# Patient Record
Sex: Male | Born: 1960 | Race: Black or African American | Hispanic: No | Smoking: Never smoker
Health system: Southern US, Community
[De-identification: ages and names within clinical notes are randomized; demographics above are authoritative.]

## PROBLEM LIST (undated history)

## (undated) DIAGNOSIS — K625 Hemorrhage of anus and rectum: Secondary | ICD-10-CM

## (undated) HISTORY — DX: Hemorrhage of anus and rectum: K62.5

## (undated) HISTORY — PX: HERNIA REPAIR: SHX51

---

## 2001-03-28 HISTORY — PX: VASECTOMY: SHX75

## 2005-05-07 ENCOUNTER — Emergency Department: Payer: Self-pay | Admitting: Emergency Medicine

## 2008-06-02 ENCOUNTER — Emergency Department: Payer: Self-pay | Admitting: Emergency Medicine

## 2010-05-24 ENCOUNTER — Emergency Department (HOSPITAL_COMMUNITY): Admission: EM | Admit: 2010-05-24 | Discharge: 2010-05-24 | Payer: Self-pay | Admitting: Emergency Medicine

## 2010-07-29 ENCOUNTER — Emergency Department (HOSPITAL_COMMUNITY): Admission: EM | Admit: 2010-07-29 | Discharge: 2010-07-29 | Payer: Self-pay | Admitting: Emergency Medicine

## 2010-12-14 ENCOUNTER — Emergency Department (HOSPITAL_COMMUNITY): Payer: Self-pay

## 2010-12-14 ENCOUNTER — Emergency Department (HOSPITAL_COMMUNITY)
Admission: EM | Admit: 2010-12-14 | Discharge: 2010-12-15 | Disposition: A | Discharge status: Home / Self Care | Payer: Self-pay | Attending: Emergency Medicine | Admitting: Emergency Medicine

## 2010-12-14 DIAGNOSIS — R220 Localized swelling, mass and lump, head: Secondary | ICD-10-CM | POA: Insufficient documentation

## 2010-12-14 DIAGNOSIS — R221 Localized swelling, mass and lump, neck: Secondary | ICD-10-CM | POA: Insufficient documentation

## 2010-12-14 DIAGNOSIS — R509 Fever, unspecified: Secondary | ICD-10-CM | POA: Insufficient documentation

## 2010-12-14 DIAGNOSIS — J36 Peritonsillar abscess: Secondary | ICD-10-CM | POA: Insufficient documentation

## 2010-12-14 DIAGNOSIS — R599 Enlarged lymph nodes, unspecified: Secondary | ICD-10-CM | POA: Insufficient documentation

## 2010-12-14 LAB — DIFFERENTIAL
Basophils Absolute: 0.1 10*3/uL (ref 0.0–0.1)
Basophils Relative: 0 % (ref 0–1)
Eosinophils Absolute: 0.2 10*3/uL (ref 0.0–0.7)
Monocytes Relative: 8 % (ref 3–12)
Neutro Abs: 11.1 10*3/uL — ABNORMAL HIGH (ref 1.7–7.7)
Neutrophils Relative %: 75 % (ref 43–77)

## 2010-12-14 LAB — CBC
Hemoglobin: 15 g/dL (ref 13.0–17.0)
MCH: 27.6 pg (ref 26.0–34.0)
MCHC: 34 g/dL (ref 30.0–36.0)
Platelets: 274 10*3/uL (ref 150–400)
RBC: 5.44 MIL/uL (ref 4.22–5.81)

## 2010-12-14 LAB — PROTIME-INR
INR: 1.01 (ref 0.00–1.49)
Prothrombin Time: 13.5 seconds (ref 11.6–15.2)

## 2010-12-14 LAB — POCT I-STAT, CHEM 8
HCT: 48 % (ref 39.0–52.0)
Hemoglobin: 16.3 g/dL (ref 13.0–17.0)
Potassium: 3.9 mEq/L (ref 3.5–5.1)
Sodium: 139 mEq/L (ref 135–145)
TCO2: 25 mmol/L (ref 0–100)

## 2010-12-14 LAB — RAPID STREP SCREEN (MED CTR MEBANE ONLY): Streptococcus, Group A Screen (Direct): NEGATIVE

## 2010-12-14 MED ORDER — IOHEXOL 300 MG/ML  SOLN: 100.0000 mL | Freq: Once | INTRAMUSCULAR | Status: AC | PRN

## 2011-01-10 LAB — URINALYSIS, ROUTINE W REFLEX MICROSCOPIC
Bilirubin Urine: NEGATIVE
Nitrite: NEGATIVE
Protein, ur: 30 mg/dL — AB
Specific Gravity, Urine: 1.018 (ref 1.005–1.030)
Urobilinogen, UA: 1 mg/dL (ref 0.0–1.0)

## 2011-01-12 LAB — URINALYSIS, ROUTINE W REFLEX MICROSCOPIC
Glucose, UA: NEGATIVE mg/dL
Hgb urine dipstick: NEGATIVE
pH: 5.5 (ref 5.0–8.0)

## 2011-01-12 LAB — URINE CULTURE
Colony Count: NO GROWTH
Culture: NO GROWTH

## 2012-03-16 ENCOUNTER — Encounter (INDEPENDENT_AMBULATORY_CARE_PROVIDER_SITE_OTHER): Payer: Self-pay | Admitting: General Surgery

## 2012-03-16 ENCOUNTER — Ambulatory Visit (INDEPENDENT_AMBULATORY_CARE_PROVIDER_SITE_OTHER): Payer: Self-pay | Admitting: General Surgery

## 2012-03-16 VITALS — BP 142/80 | HR 72 | Temp 97.2°F | Resp 16 | Ht 72.0 in | Wt 217.2 lb

## 2012-03-16 DIAGNOSIS — K4090 Unilateral inguinal hernia, without obstruction or gangrene, not specified as recurrent: Secondary | ICD-10-CM

## 2012-03-16 NOTE — Patient Instructions (Signed)
[facility] ° °UMBILICAL OR INGUINAL HERNIA REPAIR: POST OP INSTRUCTIONS ° °Always review your discharge instruction sheet given to you by the facility where your surgery was performed. °IF YOU HAVE DISABILITY OR FAMILY LEAVE FORMS, YOU MUST BRING THEM TO THE OFFICE FOR PROCESSING.   °DO NOT GIVE THEM TO YOUR DOCTOR. ° °1. A  prescription for pain medication may be given to you upon discharge.  Take your pain medication as prescribed, if needed.  If narcotic pain medicine is not needed, then you may take acetaminophen (Tylenol), naprosyn (Alleve) or ibuprofen (Advil) as needed. °2. Take your usually prescribed medications unless otherwise directed. °3. If you need a refill on your pain medication, please contact your pharmacy.  They will contact our office to request authorization. Prescriptions will not be filled after 5 pm or on week-ends. °4. You should follow a light diet the first 24 hours after arrival home, such as soup and crackers, etc.  Be sure to include lots of fluids daily.  Resume your normal diet the day after surgery. °5. Most patients will experience some swelling and bruising around the umbilicus or in the groin and scrotum.  Ice packs and reclining will help.  Swelling and bruising can take several days to resolve.  °6. It is common to experience some constipation if taking pain medication after surgery.  Increasing fluid intake and taking a stool softener (such as Colace) will usually help or prevent this problem from occurring.  A mild laxative (Milk of Magnesia or Miralax) should be taken according to package directions if there are no bowel movements after 48 hours. °7. Unless discharge instructions indicate otherwise, you may remove your bandages 48 hours after surgery, and you may shower at that time.  You may have steri-strips (small skin tapes) in place directly over the incision.  These strips should be left on the skin for 7-10 days and will come off on their own.   If your surgeon used skin glue on the incision, you may shower in 24 hours.  The glue will flake off over the next 2-3 weeks.  Any sutures or staples will be removed at the office during your follow-up visit. °8. ACTIVITIES:  You may resume regular (light) daily activities beginning the next day--such as daily self-care, walking, climbing stairs--gradually increasing activities as tolerated.  You may have sexual intercourse when it is comfortable.  Refrain from any heavy lifting or straining until approved by your doctor. °a. You may drive when you are no longer taking prescription pain medication, you can comfortably wear a seatbelt, and you can safely maneuver your car and apply brakes. °b. RETURN TO WORK:  __________________________________________________________ °9. You should see your doctor in the office for a follow-up appointment approximately 2-3 weeks after your surgery.  Make sure that you call for this appointment within a day or two after you arrive home to insure a convenient appointment time. °10. OTHER INSTRUCTIONS:  __________________________________________________________________________________________________________________________________________________________________________________________  °WHEN TO CALL YOUR DOCTOR: °1. Fever over 101.0 °2. Inability to urinate °3. Nausea and/or vomiting °4. Extreme swelling or bruising °5. Continued bleeding from incision. °6. Increased pain, redness, or drainage from the incision ° °The clinic staff is available to answer your questions during regular business hours.  Please don’t hesitate to call and ask to speak to one of the nurses for clinical concerns.  If you have a medical emergency, go to the nearest emergency room or call 911.  A surgeon from [facility]   is always on call at the hospital ° ° °[address], [address], [place], [place]  [address] ? ° [address] [address], [place], [place]   [address] °[phone] ? [phone] ? FAX  [phone] °Web site: [email or web address] ° ° °

## 2012-03-16 NOTE — Progress Notes (Signed)
Patient ID: Richard Haynes, male   DOB: [birth year], 51 y.o.   MRN: [id number]  Chief Complaint  Patient presents with  . Inguinal Hernia    HPI Richard Haynes is a 51 y.o. male.  Self referred HPI 51 yom with an over 10 year history of an increasing in size more painful left groin bulge.  This area now is out almost all the time and doesn't reduce when he lies down as it used to.  He does have some discomfort at the site also.  He reports some straining with urination and with bowel movements. Also states he has some occasional rectal pain and brb on tp.  The left groin pain is worse with movement or straining.  It is relieved with rest. He comes in today to discuss options for repair.  Past Medical History  Diagnosis Date  . Inguinal hernia   . Rectal bleeding     Past Surgical History  Procedure Date  . Vasectomy 03/2001    History reviewed. No pertinent family history.  Social History History  Substance Use Topics  . Smoking status: Never Smoker   . Smokeless tobacco: Never Used  . Alcohol Use: Yes     social    No Known Allergies  No current outpatient prescriptions on file.    Review of Systems Review of Systems  Constitutional: Negative for fever, chills and unexpected weight change.  HENT: Negative for hearing loss, congestion, sore throat, trouble swallowing and voice change.   Eyes: Negative for visual disturbance.  Respiratory: Negative for cough and wheezing.   Cardiovascular: Negative for chest pain, palpitations and leg swelling.  Gastrointestinal: Positive for anal bleeding (consistent with hemorrhoids that he says flare at times, will refer for colonoscopy once hernia fixed). Negative for nausea, vomiting, abdominal pain, diarrhea, constipation, blood in stool, abdominal distention and rectal pain.  Genitourinary: Negative for hematuria and difficulty urinating.  Musculoskeletal: Negative for arthralgias.  Skin: Negative for rash and wound.    Neurological: Negative for seizures, syncope, weakness and headaches.  Hematological: Negative for adenopathy. Does not bruise/bleed easily.  Psychiatric/Behavioral: Negative for confusion.    Blood pressure 142/80, pulse 72, temperature 97.2 F (36.2 C), temperature source Temporal, resp. rate 16, height 6' (1.829 m), weight 217 lb 4 oz (98.544 kg).  Physical Exam Physical Exam  Vitals reviewed. Constitutional: He appears well-developed and well-nourished.  Neck: Neck supple.  Cardiovascular: Normal rate, regular rhythm and normal heart sounds.   Pulmonary/Chest: Effort normal and breath sounds normal. He has no wheezes. He has no rales.  Abdominal: Soft. Bowel sounds are normal. There is no tenderness. A hernia is present. Hernia confirmed positive in the left inguinal area (nontender difficult to reduce left groin hernia). Hernia confirmed negative in the right inguinal area.  Lymphadenopathy:    He has no cervical adenopathy.       Right: No inguinal adenopathy present.       Left: No inguinal adenopathy present.     Assessment    Left inguinal hernia    Plan    We discussed observation versus repair.  We discussed both laparoscopic and open inguinal hernia repairs. I described the procedure in detail.  The patient was given educational material.  Goals should be achieved with surgery. We discussed the usage of mesh and the rationale behind that. We went over the pathophysiology of inguinal hernia. We have elected to perform open inguinal hernia repair with mesh.  We discussed the risks including bleeding,  infection, recurrence, postoperative pain and chronic groin pain, testicular injury, urinary retention, numbness in groin and around incision.         Deseree Zemaitis 03/16/2012, 11:15 PM

## 2012-03-22 IMAGING — CT CT NECK W/ CM
4 series · 11 of 20 positions shown, 12 images · IV contrast (omnipaque)
Comparison: None

CLINICAL DATA: Sore throat.  Left neck swelling.

CT NECK WITH CONTRAST
TECHNIQUE: Multidetector CT imaging of the neck was performed with
intravenous contrast.
Contrast: 75 ml Omnipaque 300 IV.

[Series 2: 2cc/30ml and 1cc/45ml · axial · 0.56mm/px · z∈[-293,-128]mm · 3 of 133 slices shown]
[im 34/133  bone]
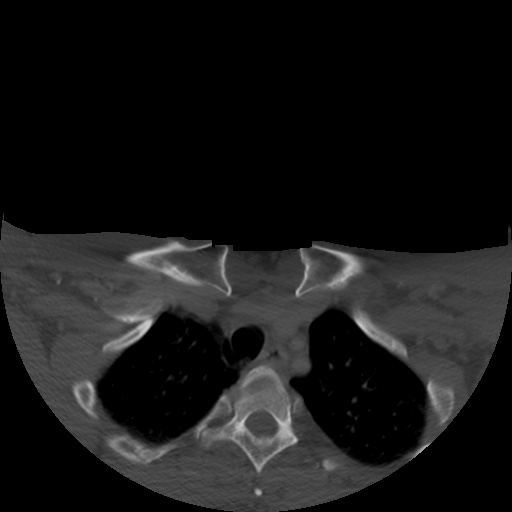
[im 67/133  bone]
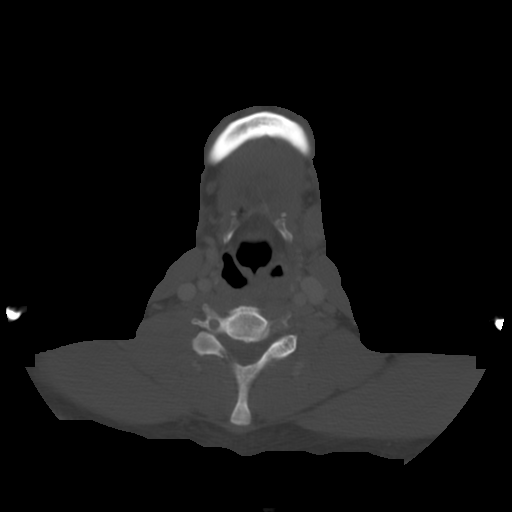
[im 100/133  bone]
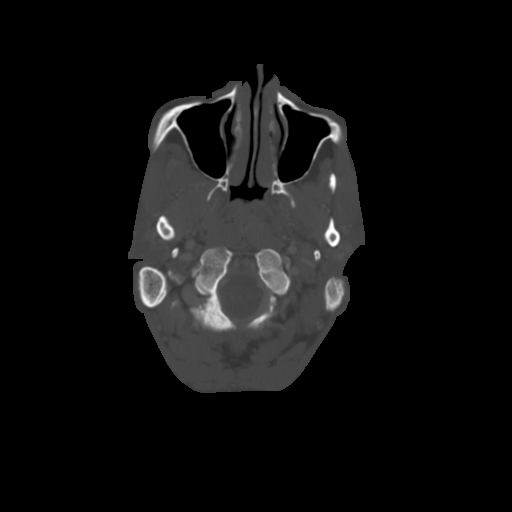

[Series 300: sag · sagittal · 0.66mm/px · 2 of 125 slices shown]
[im 42/125  bone]
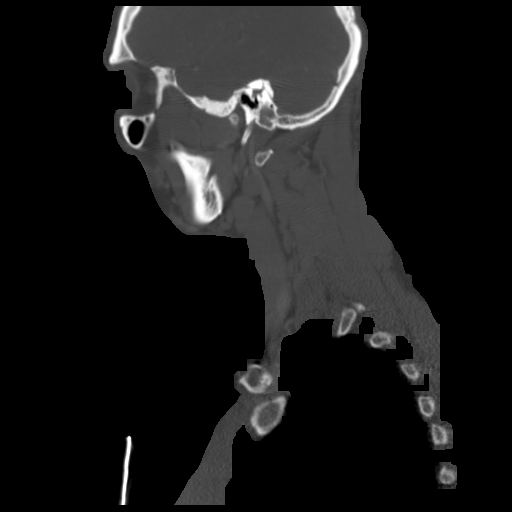
[im 83/125  bone]
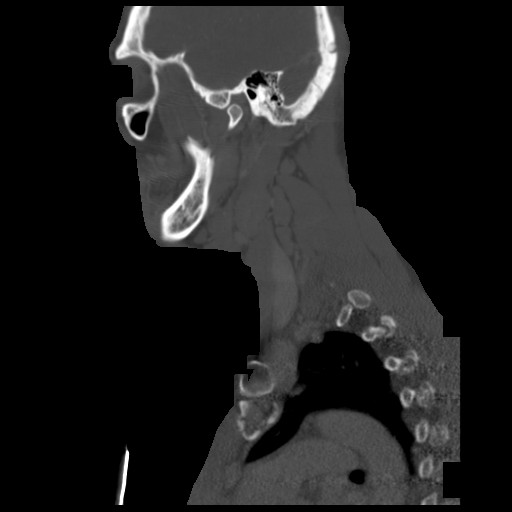

[Series 301: cor · coronal · 0.66mm/px · 3 of 158 slices shown]
[im 59/158  bone]
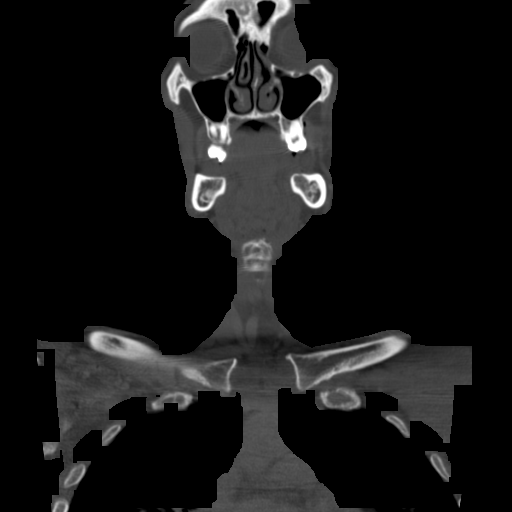
[im 70/158  bone]
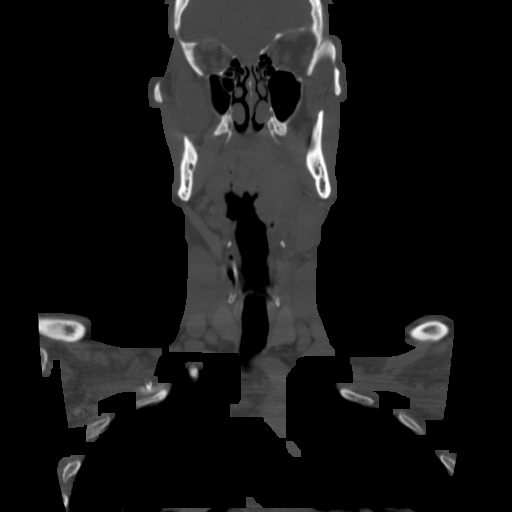
[im 81/158  bone]
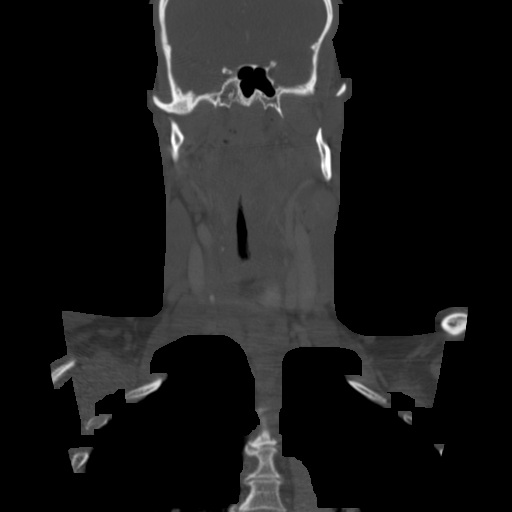

[Series 302: hyoid · axial · 0.66mm/px · z∈[-365,-224]mm · 3 of 166 slices shown, 4 images]
[im 42/166  soft-tissue]
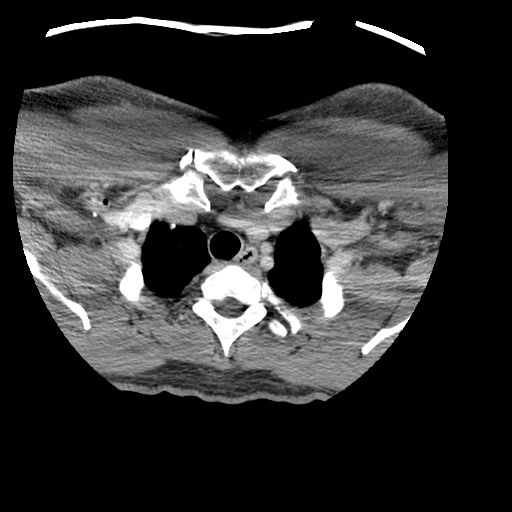
[im 42/166  bone]
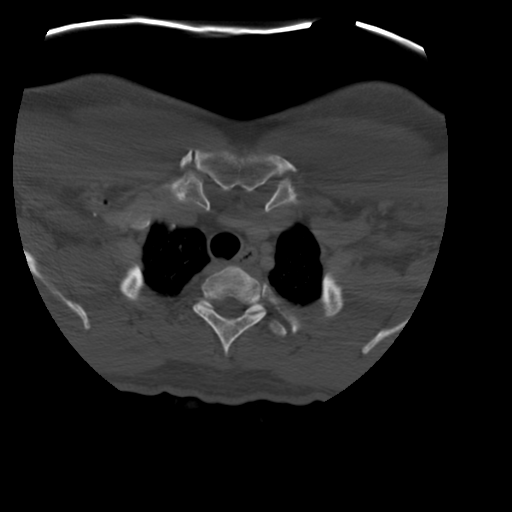
[im 83/166  bone]
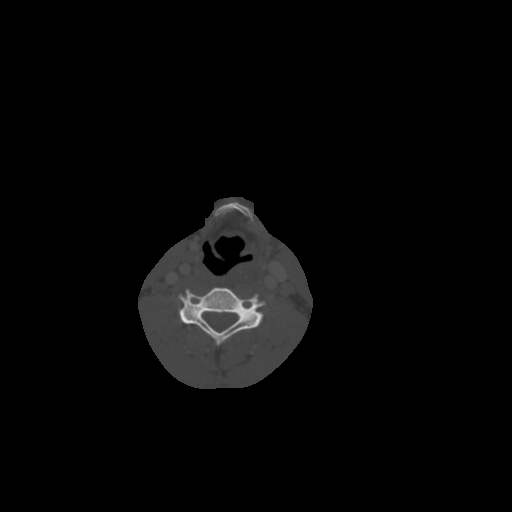
[im 124/166  bone]
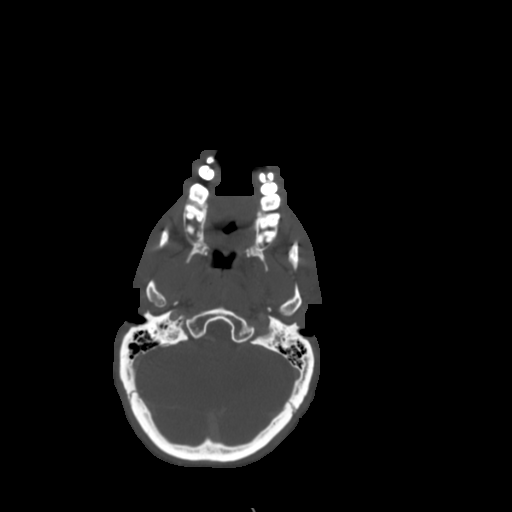

[11 of 20 positions shown; findings below may reference images not displayed]

FINDINGS: There is diffuse enlargement of the tonsils.  This is
most pronounced in the left tonsils.  There is a focal low density
area within the left tonsil measuring 17 x 12 mm compatible with
left tonsillar abscess.  Enlarged cervical lymph nodes are noted
bilaterally in all nodal stations.  This is more pronounced on the
left.  Largest node is a left jugulodigastric node measuring 2.2 x
1.9 cm (corresponds to area of palpable abnormality).

Airway is patent.  Epiglottis and aryepiglottic folds are normal.
Thyroid and salivary glands unremarkable.  Paranasal sinuses are
clear.
IMPRESSION: Tonsillar enlargement bilaterally, left greater than right.  17 mm
abscess in the left tonsil.

Bilateral cervical adenopathy, left worse than right.  The palpable
area of swelling in the left neck corresponds to the largest lymph
node measuring up to 2.2 cm.

## 2012-04-08 ENCOUNTER — Encounter (HOSPITAL_BASED_OUTPATIENT_CLINIC_OR_DEPARTMENT_OTHER): Payer: Self-pay

## 2012-04-08 ENCOUNTER — Ambulatory Visit (HOSPITAL_BASED_OUTPATIENT_CLINIC_OR_DEPARTMENT_OTHER): Admit: 2012-04-08 | Payer: Self-pay | Admitting: General Surgery

## 2012-04-08 SURGERY — REPAIR, HERNIA, INGUINAL, LAPAROSCOPIC
Anesthesia: General | Laterality: Right

## 2015-08-13 ENCOUNTER — Emergency Department
Admission: EM | Admit: 2015-08-13 | Discharge: 2015-08-13 | Disposition: A | Discharge status: Home / Self Care | Payer: 59 | Attending: Emergency Medicine | Admitting: Emergency Medicine

## 2015-08-13 ENCOUNTER — Encounter: Payer: Self-pay | Admitting: Emergency Medicine

## 2015-08-13 DIAGNOSIS — K4090 Unilateral inguinal hernia, without obstruction or gangrene, not specified as recurrent: Secondary | ICD-10-CM

## 2015-08-13 DIAGNOSIS — K4091 Unilateral inguinal hernia, without obstruction or gangrene, recurrent: Secondary | ICD-10-CM | POA: Insufficient documentation

## 2015-08-13 NOTE — Discharge Instructions (Signed)
You were evaluated for left-sided inguinal hernia.  Return to the emergency department for any worsening pain, including if the hernia get stuck in the out positioning and you cannot Push it back in.  I'm recommending follow-up with surgeon, Dr. Egbert GaribaldiBird, to discuss outpatient surgical repair. Call tomorrow to make an appointment next 1-2 weeks.   Inguinal Hernia, Adult Muscles help keep everything in the body in its proper place. But if a weak spot in the muscles develops, something can poke through. That is called a hernia. When this happens in the lower part of the belly (abdomen), it is called an inguinal hernia. (It takes its name from a part of the body in this region called the inguinal canal.) A weak spot in the wall of muscles lets some fat or part of the small intestine bulge through. An inguinal hernia can develop at any age. Men get them more often than women. CAUSES  In adults, an inguinal hernia develops over time.  It can be triggered by:  Suddenly straining the muscles of the lower abdomen.  Lifting heavy objects.  Straining to have a bowel movement. Difficult bowel movements (constipation) can lead to this.  Constant coughing. This may be caused by smoking or lung disease.  Being overweight.  Being pregnant.  Working at a job that requires long periods of standing or heavy lifting.  Having had an inguinal hernia before. One type can be an emergency situation. It is called a strangulated inguinal hernia. It develops if part of the small intestine slips through the weak spot and cannot get back into the abdomen. The blood supply can be cut off. If that happens, part of the intestine may die. This situation requires emergency surgery. SYMPTOMS  Often, a small inguinal hernia has no symptoms. It is found when a healthcare provider does a physical exam. Larger hernias usually have symptoms.   In adults, symptoms may include:  A lump in the groin. This is easier to see when  the person is standing. It might disappear when lying down.  In men, a lump in the scrotum.  Pain or burning in the groin. This occurs especially when lifting, straining or coughing.  A dull ache or feeling of pressure in the groin.  Signs of a strangulated hernia can include:  A bulge in the groin that becomes very painful and tender to the touch.  A bulge that turns red or purple.  Fever, nausea and vomiting.  Inability to have a bowel movement or to pass gas. DIAGNOSIS  To decide if you have an inguinal hernia, a healthcare provider will probably do a physical examination.  This will include asking questions about any symptoms you have noticed.  The healthcare provider might feel the groin area and ask you to cough. If an inguinal hernia is felt, the healthcare provider may try to slide it back into the abdomen.  Usually no other tests are needed. TREATMENT  Treatments can vary. The size of the hernia makes a difference. Options include:  Watchful waiting. This is often suggested if the hernia is small and you have had no symptoms.  No medical procedure will be done unless symptoms develop.  You will need to watch closely for symptoms. If any occur, contact your healthcare provider right away.  Surgery. This is used if the hernia is larger or you have symptoms.  Open surgery. This is usually an outpatient procedure (you will not stay overnight in a hospital). An cut (incision) is made  through the skin in the groin. The hernia is put back inside the abdomen. The weak area in the muscles is then repaired by herniorrhaphy or hernioplasty. Herniorrhaphy: in this type of surgery, the weak muscles are sewn back together. Hernioplasty: a patch or mesh is used to close the weak area in the abdominal wall.  Laparoscopy. In this procedure, a surgeon makes small incisions. A thin tube with a tiny video camera (called a laparoscope) is put into the abdomen. The surgeon repairs the hernia  with mesh by looking with the video camera and using two long instruments. HOME CARE INSTRUCTIONS   After surgery to repair an inguinal hernia:  You will need to take pain medicine prescribed by your healthcare provider. Follow all directions carefully.  You will need to take care of the wound from the incision.  Your activity will be restricted for awhile. This will probably include no heavy lifting for several weeks. You also should not do anything too active for a few weeks. When you can return to work will depend on the type of job that you have.  During "watchful waiting" periods, you should:  Maintain a healthy weight.  Eat a diet high in fiber (fruits, vegetables and whole grains).  Drink plenty of fluids to avoid constipation. This means drinking enough water and other liquids to keep your urine clear or pale yellow.  Do not lift heavy objects.  Do not stand for long periods of time.  Quit smoking. This should keep you from developing a frequent cough. SEEK MEDICAL CARE IF:   A bulge develops in your groin area.  You feel pain, a burning sensation or pressure in the groin. This might be worse if you are lifting or straining.  You develop a fever of more than 100.5 F (38.1 C). SEEK IMMEDIATE MEDICAL CARE IF:   Pain in the groin increases suddenly.  A bulge in the groin gets bigger suddenly and does not go down.  For men, there is sudden pain in the scrotum. Or, the size of the scrotum increases.  A bulge in the groin area becomes red or purple and is painful to touch.  You have nausea or vomiting that does not go away.  You feel your heart beating much faster than normal.  You cannot have a bowel movement or pass gas.  You develop a fever of more than 102.0 F (38.9 C).   This information is not intended to replace advice given to you by your health care provider. Make sure you discuss any questions you have with your health care provider.   Document  Released: 03/02/2009 Document Revised: 01/06/2012 Document Reviewed: 04/17/2015 Elsevier Interactive Patient Education [employer]

## 2015-08-13 NOTE — ED Provider Notes (Signed)
[facility] Emergency Department Provider Note   ____________________________________________  Time seen: 2:45 PM I have reviewed the triage vital signs and the triage nursing note.  HISTORY  Chief Complaint Inguinal Hernia   Historian Patient  HPI Richard Haynes is a 54 y.o. male who is here for evaluation of left-sided inguinal hernia. He has a history of an inguinal hernia which was intermittently out for several years. He has recently started a new job where there is some heavy lifting and pushing, and he is noted increased pain at the left groin, increased swelling, and the hernia has been in the out position for about 2 weeks now. He is able to push it back in from a standing position and sometimes from lying down.  Patient was offered surgical repair several years ago when he had his vasectomy, at that point time he decided to hold off. He is interested in referral to have this surgically repaired at this time.    Past Medical History  Diagnosis Date  . Inguinal hernia   . Rectal bleeding     There are no active problems to display for this patient.   Past Surgical History  Procedure Laterality Date  . Vasectomy  03/2001    No current outpatient prescriptions on file.  Allergies Iodine  No family history on file.  Social History Social History  Substance Use Topics  . Smoking status: Never Smoker   . Smokeless tobacco: Never Used  . Alcohol Use: Yes     Comment: social    Review of Systems  Constitutional: Negative for fever. Eyes: Negative for visual changes. ENT: Negative for sore throat. Cardiovascular: Negative for chest pain. Respiratory: Negative for shortness of breath. Gastrointestinal: Negative for abdominal pain, vomiting and diarrhea. Genitourinary: Negative for dysuria. Musculoskeletal: Negative for back pain. Skin: Negative for rash. Neurological: Negative for headache. 10 point Review of Systems otherwise  negative ____________________________________________   PHYSICAL EXAM:  VITAL SIGNS: ED Triage Vitals  Enc Vitals Group     BP 08/13/15 1400 124/74 mmHg     Pulse Rate 08/13/15 1400 69     Resp 08/13/15 1400 18     Temp 08/13/15 1400 98.6 F (37 C)     Temp Source 08/13/15 1400 Oral     SpO2 08/13/15 1400 97 %     Weight 08/13/15 1400 230 lb (104.327 kg)     Height 08/13/15 1400 6' (1.829 m)     Head Cir --      Peak Flow --      Pain Score 08/13/15 1404 10     Pain Loc --      Pain Edu? --      Excl. in GC? --      Constitutional: Alert and oriented. Well appearing and in no distress. Eyes: Conjunctivae are normal. PERRL. Normal extraocular movements. ENT   Head: Normocephalic and atraumatic.   Nose: No congestion/rhinnorhea.   Mouth/Throat: Mucous membranes are moist.   Neck: No stridor. Cardiovascular/Chest: Normal rate, regular rhythm.  No murmurs, rubs, or gallops. Respiratory: Normal respiratory effort without tachypnea nor retractions. Breath sounds are clear and equal bilaterally. No wheezes/rales/rhonchi. Gastrointestinal: Soft. No distention, no guarding, no rebound. Nontender.  Genitourinary/rectal: Left-sided inguinal hernia which is tender to palpation in the opposition, but easily reducible. Musculoskeletal: Nontender with normal range of motion in all extremities. No joint effusions.  No lower extremity tenderness.  No edema. Neurologic:  Normal speech and language. No gross or focal  neurologic deficits are appreciated. Skin:  Skin is warm, dry and intact. No rash noted. Psychiatric: Mood and affect are normal. Speech and behavior are normal. Patient exhibits appropriate insight and judgment.  ____________________________________________   EKG I, Governor Rooks, MD, the attending physician have personally viewed and interpreted all ECGs.  No EKG performed ____________________________________________  LABS (pertinent  positives/negatives)  None  ____________________________________________  RADIOLOGY All Xrays were viewed by me. Imaging interpreted by Radiologist.  None __________________________________________  PROCEDURES  Procedure(s) performed: None  Critical Care performed: None  ____________________________________________   ED COURSE / ASSESSMENT AND PLAN  CONSULTATIONS: None  Pertinent labs & imaging results that were available during my care of the patient were reviewed by me and considered in my medical decision making (see chart for details).   Patient does have a moderate-sized reducible left inguinal hernia. He will be referred to surgery for evaluation for the possibility of surgical repair. We discussed return precautions with regard to hernia that becomes irreducible or painful.  Patient / Family / Caregiver informed of clinical course, medical decision-making process, and agree with plan.   I discussed return precautions, follow-up instructions, and discharged instructions with patient and/or family.  ___________________________________________   FINAL CLINICAL IMPRESSION(S) / ED DIAGNOSES   Final diagnoses:  Left inguinal hernia       Governor Rooks, MD 08/13/15 1501

## 2015-08-13 NOTE — ED Notes (Signed)
Pt reports left side inguinal hernia that has been out now for 2 weeks. Color wnl

## 2015-08-14 ENCOUNTER — Ambulatory Visit: Payer: Self-pay | Admitting: Surgery

## 2016-01-24 ENCOUNTER — Emergency Department: Payer: No Typology Code available for payment source

## 2016-01-24 ENCOUNTER — Encounter: Payer: Self-pay | Admitting: *Deleted

## 2016-01-24 ENCOUNTER — Emergency Department
Admission: EM | Admit: 2016-01-24 | Discharge: 2016-01-24 | Disposition: A | Discharge status: Home / Self Care | Payer: No Typology Code available for payment source | Attending: Emergency Medicine | Admitting: Emergency Medicine

## 2016-01-24 DIAGNOSIS — Y92410 Unspecified street and highway as the place of occurrence of the external cause: Secondary | ICD-10-CM | POA: Insufficient documentation

## 2016-01-24 DIAGNOSIS — Y9389 Activity, other specified: Secondary | ICD-10-CM | POA: Diagnosis not present

## 2016-01-24 DIAGNOSIS — Y999 Unspecified external cause status: Secondary | ICD-10-CM | POA: Diagnosis not present

## 2016-01-24 DIAGNOSIS — S39012A Strain of muscle, fascia and tendon of lower back, initial encounter: Secondary | ICD-10-CM | POA: Insufficient documentation

## 2016-01-24 DIAGNOSIS — M545 Low back pain: Secondary | ICD-10-CM | POA: Diagnosis present

## 2016-01-24 MED ORDER — IBUPROFEN 800 MG PO TABS: 800.0000 mg | ORAL_TABLET | Freq: Three times a day (TID) | ORAL | Status: DC | PRN

## 2016-01-24 MED ORDER — CYCLOBENZAPRINE HCL 10 MG PO TABS: 10.0000 mg | ORAL_TABLET | Freq: Three times a day (TID) | ORAL | Status: DC | PRN

## 2016-01-24 NOTE — Discharge Instructions (Signed)
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
## 2016-12-15 ENCOUNTER — Encounter: Payer: Self-pay | Admitting: Emergency Medicine

## 2016-12-15 ENCOUNTER — Emergency Department
Admission: EM | Admit: 2016-12-15 | Discharge: 2016-12-15 | Disposition: A | Discharge status: Home / Self Care | Payer: Self-pay | Attending: Emergency Medicine | Admitting: Emergency Medicine

## 2016-12-15 DIAGNOSIS — R1110 Vomiting, unspecified: Secondary | ICD-10-CM

## 2016-12-15 DIAGNOSIS — R197 Diarrhea, unspecified: Secondary | ICD-10-CM | POA: Insufficient documentation

## 2016-12-15 DIAGNOSIS — R112 Nausea with vomiting, unspecified: Secondary | ICD-10-CM | POA: Insufficient documentation

## 2016-12-15 DIAGNOSIS — R55 Syncope and collapse: Secondary | ICD-10-CM | POA: Insufficient documentation

## 2016-12-15 DIAGNOSIS — Z791 Long term (current) use of non-steroidal anti-inflammatories (NSAID): Secondary | ICD-10-CM | POA: Insufficient documentation

## 2016-12-15 DIAGNOSIS — Z79899 Other long term (current) drug therapy: Secondary | ICD-10-CM | POA: Insufficient documentation

## 2016-12-15 LAB — URINALYSIS, COMPLETE (UACMP) WITH MICROSCOPIC
Bacteria, UA: NONE SEEN
Bilirubin Urine: NEGATIVE
Glucose, UA: NEGATIVE mg/dL
HGB URINE DIPSTICK: NEGATIVE
Ketones, ur: NEGATIVE mg/dL
Leukocytes, UA: NEGATIVE
NITRITE: NEGATIVE
PH: 5 (ref 5.0–8.0)
Protein, ur: 30 mg/dL — AB
SPECIFIC GRAVITY, URINE: 1.024 (ref 1.005–1.030)
Squamous Epithelial / LPF: NONE SEEN

## 2016-12-15 LAB — COMPREHENSIVE METABOLIC PANEL
ALBUMIN: 4.4 g/dL (ref 3.5–5.0)
ALK PHOS: 55 U/L (ref 38–126)
ALT: 56 U/L (ref 17–63)
ANION GAP: 8 (ref 5–15)
AST: 45 U/L — ABNORMAL HIGH (ref 15–41)
BILIRUBIN TOTAL: 1 mg/dL (ref 0.3–1.2)
BUN: 19 mg/dL (ref 6–20)
CALCIUM: 8.5 mg/dL — AB (ref 8.9–10.3)
CO2: 24 mmol/L (ref 22–32)
Chloride: 104 mmol/L (ref 101–111)
Creatinine, Ser: 1.24 mg/dL (ref 0.61–1.24)
GFR calc Af Amer: >60 mL/min (ref >60)
GFR calc non Af Amer: >60 mL/min (ref >60)
GLUCOSE: 99 mg/dL (ref 65–99)
Potassium: 3.5 mmol/L (ref 3.5–5.1)
Sodium: 136 mmol/L (ref 135–145)
TOTAL PROTEIN: 8.2 g/dL — AB (ref 6.5–8.1)

## 2016-12-15 LAB — CBC
HCT: 47.9 % (ref 40.0–52.0)
HEMOGLOBIN: 15.4 g/dL (ref 13.0–18.0)
MCH: 26.2 pg (ref 26.0–34.0)
MCHC: 32.1 g/dL (ref 32.0–36.0)
MCV: 81.7 fL (ref 80.0–100.0)
PLATELETS: 199 10*3/uL (ref 150–440)
RBC: 5.87 MIL/uL (ref 4.40–5.90)
RDW: 14.6 % — AB (ref 11.5–14.5)
WBC: 7.6 10*3/uL (ref 3.8–10.6)

## 2016-12-15 LAB — LIPASE, BLOOD: Lipase: 24 U/L (ref 11–51)

## 2016-12-15 MED ORDER — ONDANSETRON 4 MG PO TBDP
ORAL_TABLET | ORAL | Status: AC
  Filled 2016-12-15: qty 1

## 2016-12-15 MED ORDER — ONDANSETRON 4 MG PO TBDP
4.0000 mg | ORAL_TABLET | Freq: Once | ORAL | Status: AC
  Administered 2016-12-15: 4 mg via ORAL

## 2016-12-15 MED ORDER — ONDANSETRON 4 MG PO TBDP: 4.0000 mg | ORAL_TABLET | Freq: Four times a day (QID) | ORAL | 0 refills | Status: DC | PRN

## 2016-12-15 NOTE — Discharge Instructions (Signed)
You were seen in the emergency room for vomiting and diarrhea. It is important that you follow up closely with your primary care doctor in the next couple of days.  Please return to the emergency room right away if you are to develop a fever, pass-out again, have severe nausea, your pain becomes severe or worsens, you are unable to keep food down, begin vomiting any dark or bloody fluid, you develop any dark or bloody stools, feel dehydrated, or other new concerns or symptoms arise.

## 2016-12-15 NOTE — ED Provider Notes (Signed)
[facility] Emergency Department Provider Note   ____________________________________________   First MD Initiated Contact with Patient 12/15/16 1552     (approximate)  I have reviewed the triage vital signs and the nursing notes.   HISTORY  Chief Complaint Emesis; Diarrhea; and Loss of Consciousness    HPI Richard Haynes is a 56 y.o. 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 He went to work on Friday, while at work he started feeling warm and hot, nauseated and again vomited and passed out. He awoke on the floor, and nurse at his work evaluated him and recommended he go follow up with urgent care as he was feeling better after that.  The last day and a half he began experiencing loose watery diarrhea. Nonbloody. Occasional nausea, and he did vomit once last night and once this morning. He is able to eat and drink fluids, but reports sometimes after eating his stomach sphincter getting upset. He reports he has not had any further diarrhea for about the last 6 hours, now feels fine. He does report he needs a note to return to work Monday.  He did not strike his head hard. He denies headache. No neck pain. No chest pain or shortness of breath. Denies any abdominal pain. Reports he feels fine now, but would at least like something perhaps to take for nausea if he feels nauseated with eating later today   Past Medical History:  Diagnosis Date  . Inguinal hernia   . Rectal bleeding     There are no active problems to display for this patient.   Past Surgical History:  Procedure Laterality Date  . VASECTOMY  03/2001    Prior to Admission medications   Medication Sig Start Date End Date Taking? Authorizing Provider  cyclobenzaprine (FLEXERIL) 10 MG tablet Take 1 tablet (10 mg total) by mouth every 8 (eight) hours as needed for  muscle spasms. 01/24/16   Charmayne Sheerharles M Beers, PA-C  ibuprofen (ADVIL,MOTRIN) 800 MG tablet Take 1 tablet (800 mg total) by mouth every 8 (eight) hours as needed. 01/24/16   Charmayne Sheerharles M Beers, PA-C  ondansetron (ZOFRAN ODT) 4 MG disintegrating tablet Take 1 tablet (4 mg total) by mouth every 6 (six) hours as needed for nausea or vomiting. 12/15/16   Sharyn CreamerMark Quale, MD    Allergies Iodine  No family history on file.  Social History Social History  Substance Use Topics  . Smoking status: Never Smoker  . Smokeless tobacco: Never Used  . Alcohol use Yes     Comment: social    Review of Systems Constitutional: No fever/chills Eyes: No visual changes. ENT: No sore throat. Cardiovascular: Denies chest pain. Respiratory: Denies shortness of breath. Gastrointestinal: No abdominal pain.  No constipation. Genitourinary: Negative for dysuria. Musculoskeletal: Negative for back pain. Skin: Negative for rash. Neurological: Negative for headaches, focal weakness or numbness.  10-point ROS otherwise negative.  ____________________________________________   PHYSICAL EXAM:  VITAL SIGNS: ED Triage Vitals  Enc Vitals Group     BP 12/15/16 1248 117/88     Pulse Rate 12/15/16 1248 79     Resp 12/15/16 1248 20     Temp 12/15/16 1248 99.4 F (37.4 C)     Temp Source 12/15/16 1248 Oral     SpO2 12/15/16 1248 98 %     Weight 12/15/16 1250 220 lb (  99.8 kg)     Height 12/15/16 1250 6' (1.829 m)     Head Circumference --      Peak Flow --      Pain Score 12/15/16 1248 0     Pain Loc --      Pain Edu? --      Excl. in GC? --     Constitutional: Alert and oriented. Well appearing and in no acute distress.Very pleasant, seated in chair. He ambulates to the bed without any difficulty Eyes: Conjunctivae are normal. PERRL. EOMI. Head: Atraumatic. Nose: No congestion/rhinnorhea. Mouth/Throat: Mucous membranes are moist.  Oropharynx non-erythematous. Neck: No stridor.   Cardiovascular: Normal rate,  regular rhythm. Grossly normal heart sounds.  Good peripheral circulation. Respiratory: Normal respiratory effort.  No retractions. Lungs CTAB. Gastrointestinal: Soft and nontender. No distention. Muscular build. No tenderness. Musculoskeletal: No lower extremity tenderness nor edema.  No joint effusions. Neurologic:  Normal speech and language. No gross focal neurologic deficits are appreciated. No gait instability. Skin:  Skin is warm, dry and intact. No rash noted. Psychiatric: Mood and affect are normal. Speech and behavior are normal.  ____________________________________________   LABS (all labs ordered are listed, but only abnormal results are displayed)  Labs Reviewed  COMPREHENSIVE METABOLIC PANEL - Abnormal; Notable for the following:       Result Value   Calcium 8.5 (*)    Total Protein 8.2 (*)    AST 45 (*)    All other components within normal limits  CBC - Abnormal; Notable for the following:    RDW 14.6 (*)    All other components within normal limits  URINALYSIS, COMPLETE (UACMP) WITH MICROSCOPIC - Abnormal; Notable for the following:    Color, Urine YELLOW (*)    APPearance CLEAR (*)    Protein, ur 30 (*)    All other components within normal limits  LIPASE, BLOOD   ____________________________________________  EKG  Reviewed and to resume at 1302 Ventricular rate 80 Care is 110 QTc 4:30 Normal sinus rhythm, incomplete right bundle-branch block appearance is noted. No evidence of acute ischemic change. ____________________________________________  RADIOLOGY   ____________________________________________   PROCEDURES  Procedure(s) performed: None  Procedures  Critical Care performed: No  ____________________________________________   INITIAL IMPRESSION / ASSESSMENT AND PLAN / ED COURSE  Pertinent labs & imaging results that were available during my care of the patient were reviewed by me and considered in my medical decision making (see chart  for details).  Nausea and vomiting, reports one syncopal episode occurred on Friday. No further syncopal episodes. Now having some diarrhea and occasional vomiting, but reports symptomatology has greatly improved. Reassuring clinical examination. No evidence of acute neurologic abnormality, acute cardiac condition or pulmonary symptomatology. His abdomen is soft nontender nondistended. He feels much better.  We'll prescribe Zofran, suspect likely gastroenteritis, but did discuss very careful return precautions with the patient is agreeable with the plan for close follow-up and will return if any worsening or ongoing symptomatology.  Return precautions and treatment recommendations and follow-up discussed with the patient who is agreeable with the plan.       ____________________________________________   FINAL CLINICAL IMPRESSION(S) / ED DIAGNOSES  Final diagnoses:  Vomiting and diarrhea      NEW MEDICATIONS STARTED DURING THIS VISIT:  Discharge Medication List as of 12/15/2016  4:25 PM    START taking these medications   Details  ondansetron (ZOFRAN ODT) 4 MG disintegrating tablet Take 1 tablet (4 mg total) by mouth every  6 (six) hours as needed for nausea or vomiting., Starting Sun 12/15/2016, Print         Note:  This document was prepared using Dragon voice recognition software and may include unintentional dictation errors.     Sharyn Creamer, MD 12/15/16 [phone]

## 2016-12-15 NOTE — ED Triage Notes (Signed)
Patient presents to the ED with syncopal episode on Friday night.  Patient has been taking "liposine for weight loss".  Patient reports several days of vomiting and diarrhea prior to syncopal episode.  Patient states he has not vomited since mid-day Saturday but he is continuing to have diarrhea.

## 2017-09-23 ENCOUNTER — Other Ambulatory Visit: Payer: Self-pay

## 2017-09-23 ENCOUNTER — Encounter: Payer: Self-pay | Admitting: Emergency Medicine

## 2017-09-23 ENCOUNTER — Emergency Department
Admission: EM | Admit: 2017-09-23 | Discharge: 2017-09-23 | Disposition: A | Discharge status: Home / Self Care | Payer: Worker's Compensation | Attending: Emergency Medicine | Admitting: Emergency Medicine

## 2017-09-23 DIAGNOSIS — S61211A Laceration without foreign body of left index finger without damage to nail, initial encounter: Secondary | ICD-10-CM | POA: Insufficient documentation

## 2017-09-23 DIAGNOSIS — Y990 Civilian activity done for income or pay: Secondary | ICD-10-CM | POA: Diagnosis not present

## 2017-09-23 DIAGNOSIS — Y929 Unspecified place or not applicable: Secondary | ICD-10-CM | POA: Diagnosis not present

## 2017-09-23 DIAGNOSIS — W260XXA Contact with knife, initial encounter: Secondary | ICD-10-CM | POA: Insufficient documentation

## 2017-09-23 DIAGNOSIS — Y9389 Activity, other specified: Secondary | ICD-10-CM | POA: Insufficient documentation

## 2017-09-23 MED ORDER — BACITRACIN ZINC 500 UNIT/GM EX OINT
TOPICAL_OINTMENT | Freq: Once | CUTANEOUS | Status: AC
  Administered 2017-09-23: 1 via TOPICAL
  Filled 2017-09-23: qty 0.9

## 2017-09-23 MED ORDER — BACITRACIN ZINC 500 UNIT/GM EX OINT
TOPICAL_OINTMENT | CUTANEOUS | 0 refills | Status: AC
Start: 2017-09-23 — End: 2018-09-23

## 2017-09-23 MED ORDER — LIDOCAINE HCL (PF) 1 % IJ SOLN
INTRAMUSCULAR | Status: AC
  Filled 2017-09-23: qty 5

## 2017-09-23 NOTE — ED Notes (Signed)
Patient lacerated tip of 2nd digit left hand on machine at work today. Patient reports decrease of sensation finger tip.

## 2017-09-23 NOTE — ED Provider Notes (Signed)
[facility] Emergency Department Provider Note   ____________________________________________   First MD Initiated Contact with Patient 09/23/17 [phone]     (approximate)  I have reviewed the triage vital signs and the nursing notes.   HISTORY  Chief Complaint Laceration    HPI Richard Haynes is a 56 y.o. male who comes into the hospital today with a laceration to his left index finger.  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  He reports that he cut his left index finger with a razor blade.  It bled initially but currently the bleeding is controlled.  He states that his pain is a 5-6 out of 10 in intensity.  He is able to move his finger but because it was bleeding a lot they decided to come in for evaluation.  He has some mild numbness and tingling along the area of the injury but otherwise has no other complaints.  He is here for repair.  Past Medical History:  Diagnosis Date  . Inguinal hernia   . Rectal bleeding     There are no active problems to display for this patient.   Past Surgical History:  Procedure Laterality Date  . VASECTOMY  03/2001    Prior to Admission medications   Medication Sig Start Date End Date Taking? Authorizing Provider  bacitracin ointment Apply to affected area daily 09/23/17 09/23/18  Rebecka ApleyWebster, Maeson Lourenco P, MD  cyclobenzaprine (FLEXERIL) 10 MG tablet Take 1 tablet (10 mg total) by mouth every 8 (eight) hours as needed for muscle spasms. 01/24/16   Beers, Charmayne Sheerharles M, PA-C  ibuprofen (ADVIL,MOTRIN) 800 MG tablet Take 1 tablet (800 mg total) by mouth every 8 (eight) hours as needed. 01/24/16   Beers, Charmayne Sheerharles M, PA-C  ondansetron (ZOFRAN ODT) 4 MG disintegrating tablet Take 1 tablet (4 mg total) by mouth every 6 (six) hours as needed for nausea or vomiting. 12/15/16   Sharyn CreamerQuale, Mark, MD     Allergies Iodine  No family history on file.  Social History Social History   Tobacco Use  . Smoking status: Never Smoker  . Smokeless tobacco: Never Used  Substance Use Topics  . Alcohol use: Yes    Comment: social  . Drug use: No    Review of Systems  Constitutional: No fever/chills Eyes: No visual changes. ENT: No sore throat. Cardiovascular: Denies chest pain. Respiratory: Denies shortness of breath. Gastrointestinal: No abdominal pain.  No nausea, no vomiting.  No diarrhea.  No constipation. Genitourinary: Negative for dysuria. Musculoskeletal: Negative for back pain. Skin: Left index finger laceration Neurological: Negative for headaches, focal weakness or numbness.   ____________________________________________   PHYSICAL EXAM:  VITAL SIGNS: ED Triage Vitals  Enc Vitals Group     BP 09/23/17 0504 (!) 147/88     Pulse Rate 09/23/17 0504 78     Resp 09/23/17 0504 20     Temp 09/23/17 0504 97.9 F (36.6 C)     Temp Source 09/23/17 0504 Oral     SpO2 09/23/17 0504 98 %     Weight 09/23/17 0500 220 lb (99.8 kg)     Height 09/23/17 0500 6' (1.829 m)     Head Circumference --      Peak Flow --      Pain Score 09/23/17 0459 6     Pain Loc --  Pain Edu? --      Excl. in GC? --     Constitutional: Alert and oriented. Well appearing and in mild distress. Eyes: Conjunctivae are normal. PERRL. EOMI. Head: Atraumatic. Nose: No congestion/rhinnorhea. Mouth/Throat: Mucous membranes are moist.  Oropharynx non-erythematous. Cardiovascular: Normal rate, regular rhythm. Grossly normal heart sounds.  Good peripheral circulation. Respiratory: Normal respiratory effort.  No retractions. Lungs CTAB. Gastrointestinal: Soft and nontender. No distention.  Positive bowel sounds Musculoskeletal: No lower extremity tenderness nor edema.   Neurologic:  Normal speech and language. gait instability. Skin:  Skin is warm, dry 3 cm laceration to the pad of the left  index finger bleeding controlled, color motion and sensation intact. Psychiatric: Mood and affect are normal.   ____________________________________________   LABS (all labs ordered are listed, but only abnormal results are displayed)  Labs Reviewed - No data to display ____________________________________________  EKG   ____________________________________________  RADIOLOGY  No results found.  ____________________________________________   PROCEDURES  Procedure(s) performed: please, see procedure note(s).  Marland Kitchen..Laceration Repair Date/Time: 09/23/2017 6:00 AM Performed by: Rebecka ApleyWebster, Amaia Lavallie P, MD Authorized by: Rebecka ApleyWebster, Duayne Brideau P, MD   Consent:    Consent obtained:  Verbal   Consent given by:  Patient   Risks discussed:  Infection and pain Anesthesia (see MAR for exact dosages):    Anesthesia method:  Local infiltration   Local anesthetic:  Lidocaine 1% w/o epi Laceration details:    Location:  Finger   Finger location:  L index finger   Length (cm):  3 Repair type:    Repair type:  Simple Pre-procedure details:    Preparation:  Patient was prepped and draped in usual sterile fashion Treatment:    Area cleansed with:  Betadine   Amount of cleaning:  Standard Skin repair:    Repair method:  Sutures   Suture size:  4-0   Suture material:  Nylon   Suture technique:  Simple interrupted   Number of sutures:  4 Approximation:    Approximation:  Close Post-procedure details:    Dressing:  Antibiotic ointment   Patient tolerance of procedure:  Tolerated well, no immediate complications    Critical Care performed: No  ____________________________________________   INITIAL IMPRESSION / ASSESSMENT AND PLAN / ED COURSE  As part of my medical decision making, I reviewed the following data within the electronic MEDICAL RECORD NUMBER Notes from prior ED visits and [place] Controlled Substance Database   This is a 56 year old male who comes into the with a laceration to  his left finger.  I did suture the wound as the bleeding is controlled but it is open.  The patient states that his last tetanus shot was about a year or 2 ago.  We will place some bacitracin to the area and the patient may return to work within the next 24 hours.  He should have his sutures removed in about 7-10 days.      ____________________________________________   FINAL CLINICAL IMPRESSION(S) / ED DIAGNOSES  Final diagnoses:  Laceration of left index finger without foreign body without damage to nail, initial encounter     ED Discharge Orders        Ordered    bacitracin ointment     09/23/17 0641       Note:  This document was prepared using Dragon voice recognition software and may include unintentional dictation errors.    Rebecka ApleyWebster, Aya Geisel P, MD 09/23/17 [phone]

## 2017-09-23 NOTE — ED Triage Notes (Signed)
Patient ambulatory to triage with steady gait, without difficulty or distress noted; pt reports cut left index finger while at work [employer]profile indicates UDS required

## 2017-09-23 NOTE — Discharge Instructions (Signed)
Please have sutures removed in 7-10 days. Please return with any redness, drainage or increased pain

## 2017-09-23 NOTE — ED Notes (Signed)
Applied bacitracin and sterile dressing to laceration. Reviewed d/c instructions, follow-up care and prescription with patient. Patient verbalized understanding.

## 2017-09-23 NOTE — ED Notes (Signed)
ED Provider at bedside. 

## 2017-09-23 NOTE — ED Notes (Signed)
Cleaned patient's laceration per MD order

## 2017-09-23 NOTE — ED Notes (Signed)
workmans comp UDS done and sample sent to the lab

## 2018-02-22 ENCOUNTER — Other Ambulatory Visit: Payer: Self-pay

## 2018-02-22 ENCOUNTER — Emergency Department
Admission: EM | Admit: 2018-02-22 | Discharge: 2018-02-23 | Disposition: A | Discharge status: Home / Self Care | Payer: Self-pay | Attending: Emergency Medicine | Admitting: Emergency Medicine

## 2018-02-22 DIAGNOSIS — Z5321 Procedure and treatment not carried out due to patient leaving prior to being seen by health care provider: Secondary | ICD-10-CM | POA: Insufficient documentation

## 2018-02-22 DIAGNOSIS — R350 Frequency of micturition: Secondary | ICD-10-CM | POA: Insufficient documentation

## 2018-02-22 DIAGNOSIS — R300 Dysuria: Secondary | ICD-10-CM | POA: Insufficient documentation

## 2018-02-22 DIAGNOSIS — R1084 Generalized abdominal pain: Secondary | ICD-10-CM | POA: Insufficient documentation

## 2018-02-22 LAB — URINALYSIS, COMPLETE (UACMP) WITH MICROSCOPIC
BILIRUBIN URINE: NEGATIVE
Bacteria, UA: NONE SEEN
GLUCOSE, UA: NEGATIVE mg/dL
Hgb urine dipstick: NEGATIVE
KETONES UR: NEGATIVE mg/dL
LEUKOCYTES UA: NEGATIVE
Nitrite: NEGATIVE
PH: 5 (ref 5.0–8.0)
Protein, ur: NEGATIVE mg/dL
Specific Gravity, Urine: 1.02 (ref 1.005–1.030)
Squamous Epithelial / LPF: NONE SEEN (ref 0–5)

## 2018-02-22 LAB — BASIC METABOLIC PANEL
ANION GAP: 7 (ref 5–15)
BUN: 15 mg/dL (ref 6–20)
CALCIUM: 9.1 mg/dL (ref 8.9–10.3)
CHLORIDE: 107 mmol/L (ref 101–111)
CO2: 24 mmol/L (ref 22–32)
Creatinine, Ser: 1.05 mg/dL (ref 0.61–1.24)
GFR calc Af Amer: >60 mL/min (ref >60)
GFR calc non Af Amer: >60 mL/min (ref >60)
Glucose, Bld: 121 mg/dL — ABNORMAL HIGH (ref 65–99)
Potassium: 3.5 mmol/L (ref 3.5–5.1)
Sodium: 138 mmol/L (ref 135–145)

## 2018-02-22 LAB — CBC
HCT: 43.5 % (ref 40.0–52.0)
HEMOGLOBIN: 14.5 g/dL (ref 13.0–18.0)
MCH: 27.2 pg (ref 26.0–34.0)
MCHC: 33.3 g/dL (ref 32.0–36.0)
MCV: 81.7 fL (ref 80.0–100.0)
Platelets: 203 10*3/uL (ref 150–440)
RBC: 5.32 MIL/uL (ref 4.40–5.90)
RDW: 15.3 % — ABNORMAL HIGH (ref 11.5–14.5)
WBC: 6.8 10*3/uL (ref 3.8–10.6)

## 2018-02-22 NOTE — ED Triage Notes (Signed)
Patient to ED with three weeks of right flank pain. States that he is starting to have urinary urgency and a small amount of dysuria. History of kidney stones in the past. Last occurrence was about two years ago.

## 2018-02-23 ENCOUNTER — Telehealth: Payer: Self-pay | Admitting: Emergency Medicine

## 2018-02-23 NOTE — Telephone Encounter (Signed)
Called patient due to lwot to inquire about condition and follow up plans.  He said he left because he had to go to work. He says he does not have pcp, but he does have insurance.  I explained the importance of provider exam.  He talked about waiting here for several hours.  I told him his labs were available, and that he can consider going to urgent care instead of ED for decreased wait time.

## 2018-11-02 ENCOUNTER — Other Ambulatory Visit: Payer: Self-pay

## 2018-11-02 ENCOUNTER — Emergency Department: Payer: Self-pay

## 2018-11-02 ENCOUNTER — Emergency Department
Admission: EM | Admit: 2018-11-02 | Discharge: 2018-11-02 | Disposition: A | Discharge status: Home / Self Care | Payer: Self-pay | Attending: Student in an Organized Health Care Education/Training Program | Admitting: Student in an Organized Health Care Education/Training Program

## 2018-11-02 ENCOUNTER — Encounter: Payer: Self-pay | Admitting: Emergency Medicine

## 2018-11-02 DIAGNOSIS — J40 Bronchitis, not specified as acute or chronic: Secondary | ICD-10-CM | POA: Insufficient documentation

## 2018-11-02 DIAGNOSIS — R0981 Nasal congestion: Secondary | ICD-10-CM | POA: Insufficient documentation

## 2018-11-02 LAB — CBC WITH DIFFERENTIAL/PLATELET
ABS IMMATURE GRANULOCYTES: 0.03 10*3/uL (ref 0.00–0.07)
Basophils Absolute: 0.1 10*3/uL (ref 0.0–0.1)
Basophils Relative: 1 %
Eosinophils Absolute: 0.2 10*3/uL (ref 0.0–0.5)
Eosinophils Relative: 3 %
HCT: 44.8 % (ref 39.0–52.0)
Hemoglobin: 14.1 g/dL (ref 13.0–17.0)
IMMATURE GRANULOCYTES: 0 %
Lymphocytes Relative: 21 %
Lymphs Abs: 1.6 10*3/uL (ref 0.7–4.0)
MCH: 25.8 pg — ABNORMAL LOW (ref 26.0–34.0)
MCHC: 31.5 g/dL (ref 30.0–36.0)
MCV: 82.1 fL (ref 80.0–100.0)
MONOS PCT: 7 %
Monocytes Absolute: 0.5 10*3/uL (ref 0.1–1.0)
NEUTROS ABS: 5.2 10*3/uL (ref 1.7–7.7)
NEUTROS PCT: 68 %
Platelets: 250 10*3/uL (ref 150–400)
RBC: 5.46 MIL/uL (ref 4.22–5.81)
RDW: 14.1 % (ref 11.5–15.5)
WBC: 7.6 10*3/uL (ref 4.0–10.5)
nRBC: 0 % (ref 0.0–0.2)

## 2018-11-02 LAB — BASIC METABOLIC PANEL
ANION GAP: 7 (ref 5–15)
BUN: 20 mg/dL (ref 6–20)
CO2: 25 mmol/L (ref 22–32)
Calcium: 9 mg/dL (ref 8.9–10.3)
Chloride: 106 mmol/L (ref 98–111)
Creatinine, Ser: 1.13 mg/dL (ref 0.61–1.24)
GFR calc Af Amer: >60 mL/min (ref >60)
GFR calc non Af Amer: >60 mL/min (ref >60)
Glucose, Bld: 98 mg/dL (ref 70–99)
POTASSIUM: 3.6 mmol/L (ref 3.5–5.1)
SODIUM: 138 mmol/L (ref 135–145)

## 2018-11-02 MED ORDER — GUAIFENESIN-CODEINE 100-10 MG/5ML PO SYRP: 5.0000 mL | ORAL_SOLUTION | Freq: Three times a day (TID) | ORAL | 0 refills | Status: DC | PRN

## 2018-11-02 MED ORDER — PREDNISONE 10 MG (21) PO TBPK: ORAL_TABLET | ORAL | 0 refills | Status: DC

## 2018-11-02 MED ORDER — ALBUTEROL SULFATE HFA 108 (90 BASE) MCG/ACT IN AERS: 2.0000 | INHALATION_SPRAY | RESPIRATORY_TRACT | 1 refills | Status: DC | PRN

## 2018-11-02 NOTE — Discharge Instructions (Signed)
Please establish a primary care provider when possible.  Return to the the ER for symptoms that change or worsen if unable to schedule an appointment.
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
## 2018-11-02 NOTE — ED Provider Notes (Signed)
[facility] Emergency Department Provider Note  ____________________________________________   First MD Initiated Contact with Patient 11/02/18 1900     (approximate)  I have reviewed the triage vital signs and the nursing notes.   HISTORY  Chief Complaint Nausea and Fatigue   HPI Richard Haynes is a 58 y.o. male who presents to the emergency department for treatment and evaluation of cough and congestion. He recently had the flu and feels that he had somewhat improved until he returned to work. He states that his job requires him to be around strong chemicals. The employer is supposed to provide masks, but they have been out of them for the past week. He also states that he had to work in that area for 12 hours instead of 2 hours and his cough and congestion has gotten much worse. No fever. No nausea, vomiting, or diarrhea.  Past Medical History:  Diagnosis Date  . Inguinal hernia   . Rectal bleeding     There are no active problems to display for this patient.   Past Surgical History:  Procedure Laterality Date  . VASECTOMY  03/2001    Prior to Admission medications   Medication Sig Start Date End Date Taking? Authorizing Provider  albuterol (PROVENTIL HFA;VENTOLIN HFA) 108 (90 Base) MCG/ACT inhaler Inhale 2 puffs into the lungs every 4 (four) hours as needed for wheezing or shortness of breath. 11/02/18   Rukiya Hodgkins, Rulon Eisenmengerari B, FNP  cyclobenzaprine (FLEXERIL) 10 MG tablet Take 1 tablet (10 mg total) by mouth every 8 (eight) hours as needed for muscle spasms. 01/24/16   Beers, Charmayne Sheerharles M, PA-C  guaiFENesin-codeine (ROBITUSSIN AC) 100-10 MG/5ML syrup Take 5 mLs by mouth 3 (three) times daily as needed for cough. 11/02/18   Elisha Mcgruder B, FNP  ibuprofen (ADVIL,MOTRIN) 800 MG tablet Take 1 tablet (800 mg total) by mouth every 8 (eight) hours as needed. 01/24/16   Beers, Charmayne Sheerharles M, PA-C  ondansetron (ZOFRAN ODT) 4 MG disintegrating tablet Take 1 tablet (4 mg  total) by mouth every 6 (six) hours as needed for nausea or vomiting. 12/15/16   Sharyn CreamerQuale, Mark, MD  predniSONE (STERAPRED UNI-PAK 21 TAB) 10 MG (21) TBPK tablet Take 6 tablets on the first day and decrease by 1 tablet each day until finished. 11/02/18   Chinita Pesterriplett, Nolen Lindamood B, FNP    Allergies Iodine  History reviewed. No pertinent family history.  Social History Social History   Tobacco Use  . Smoking status: Never Smoker  . Smokeless tobacco: Never Used  Substance Use Topics  . Alcohol use: Yes    Comment: social  . Drug use: No    Review of Systems  Constitutional: No fever/chills Eyes: No visual changes. ENT: No sore throat. Cardiovascular: Denies chest pain. Respiratory: Denies shortness of breath. Gastrointestinal: No abdominal pain.  No nausea, no vomiting. Genitourinary: Negative for dysuria. Musculoskeletal: Negative for back pain. Skin: Negative for rash. Neurological: Negative for headaches, focal weakness or numbness. ____________________________________________   PHYSICAL EXAM:  VITAL SIGNS: ED Triage Vitals  Enc Vitals Group     BP 11/02/18 1824 136/90     Pulse Rate 11/02/18 1824 66     Resp 11/02/18 1824 15     Temp 11/02/18 1824 98.2 F (36.8 C)     Temp Source 11/02/18 1824 Oral     SpO2 11/02/18 1824 98 %     Weight 11/02/18 1824 225 lb (102.1 kg)     Height 11/02/18 1824 6' (1.829 m)  Head Circumference --      Peak Flow --      Pain Score 11/02/18 1825 8     Pain Loc --      Pain Edu? --      Excl. in [place]? --     Constitutional: Alert and oriented. Well appearing and in no acute distress. Eyes: Conjunctivae are normal. Head: Atraumatic. Nose: Maxillary sinus congestion without rhinorrhea. Mouth/Throat: Mucous membranes are moist.  Oropharynx non-erythematous. Neck: No stridor.   Cardiovascular: Normal rate, regular rhythm. Grossly normal heart sounds.  Good peripheral circulation. Respiratory: Normal respiratory effort.  No retractions.  Lungs CTAB. Gastrointestinal: Soft and nontender.  Musculoskeletal: No lower extremity tenderness nor edema.  No joint effusions. Neurologic:  Normal speech and language. No gross focal neurologic deficits are appreciated. No gait instability. Skin:  Skin is warm, dry and intact. No rash noted. Psychiatric: Mood and affect are normal. Speech and behavior are normal.  ____________________________________________   LABS (all labs ordered are listed, but only abnormal results are displayed)  Labs Reviewed  CBC WITH DIFFERENTIAL/PLATELET - Abnormal; Notable for the following components:      Result Value   MCH 25.8 (*)    All other components within normal limits  BASIC METABOLIC PANEL   ____________________________________________  EKG  Not indicated. ____________________________________________  RADIOLOGY  ED MD interpretation:  No acute cardiopulmonary abnormality per radiology.  Official radiology report(s): Dg Chest 2 View  Result Date: 11/02/2018 CLINICAL DATA:  Toxic inhalation.  Coughing and falling. EXAM: CHEST - 2 VIEW COMPARISON:  Radiographs 12/14/2010. FINDINGS: The heart size and mediastinal contours are stable. There is stable anterior eventration of the right hemidiaphragm with adjacent linear subsegmental atelectasis or scarring. The lungs are otherwise clear. There is no pleural effusion or pneumothorax. Prominent nipple shadows are noted bilaterally. There are degenerative changes throughout the spine. IMPRESSION: Stable chest.  No acute cardiopulmonary process. Electronically Signed   By: Carey BullocksWilliam  Veazey M.D.   On: 11/02/2018 18:58    ____________________________________________   PROCEDURES  Procedure(s) performed: None  Procedures  Critical Care performed: No  ____________________________________________   INITIAL IMPRESSION / ASSESSMENT AND PLAN / ED COURSE  As part of my medical decision making, I reviewed the following data within the electronic  MEDICAL RECORD NUMBER Notes from prior ED visits  58 year old male presenting to the ER for evaluation of worsening cough and congestion after having the flu and exposure to chemicals and propane at work. Xray is reassuring. He is not tachycardic or hypoxic and respiratory rate is normal. He will be treated with albuterol, prednisone, and cough meds. 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 He was advised to establish a primary care and follow up if not improving over the next few days. He is to return to the ER for symptoms that change or worsen if unable to schedule an appointment.      ____________________________________________   FINAL CLINICAL IMPRESSION(S) / ED DIAGNOSES  Final diagnoses:  Bronchitis     ED Discharge Orders         Ordered    predniSONE (STERAPRED UNI-PAK 21 TAB) 10 MG (21) TBPK tablet     11/02/18 1929    guaiFENesin-codeine (ROBITUSSIN AC) 100-10 MG/5ML syrup  3 times daily PRN     11/02/18 1929    albuterol (PROVENTIL HFA;VENTOLIN HFA) 108 (90 Base) MCG/ACT inhaler  Every 4 hours PRN     11/02/18 1929  Note:  This document was prepared using Dragon voice recognition software and may include unintentional dictation errors.    Chinita Pester, FNP 11/02/18 2157    Willy Eddy, MD 11/02/18 2239

## 2018-11-02 NOTE — ED Triage Notes (Signed)
Pt has been exposed to paint thinner fumes and propane fumes for 1 week at work because they have been out of the masks he is supposed to wear.  Dx with flu last week. Pt sound congested. Has cough still.  Pt thinks may have bronchitis.  Chest wall hurts when coughing.

## 2018-11-02 NOTE — ED Triage Notes (Signed)
Patient arrived by EMS for propane exposure. Over the past week increased amount of exposure. Called EMS for exposure and increased pressure when coughing. EMS 98% RA, b/p 152/96. Patient reports he just got over the flu

## 2018-11-02 NOTE — ED Notes (Signed)
Discussed pt with dr Cyril Loosen. Orders placed and will send to flex per recommendations.

## 2019-07-25 ENCOUNTER — Other Ambulatory Visit: Payer: Self-pay

## 2019-07-25 ENCOUNTER — Emergency Department
Admission: EM | Admit: 2019-07-25 | Discharge: 2019-07-25 | Disposition: A | Discharge status: Home / Self Care | Payer: Worker's Compensation | Attending: Emergency Medicine | Admitting: Emergency Medicine

## 2019-07-25 ENCOUNTER — Emergency Department: Payer: Worker's Compensation

## 2019-07-25 ENCOUNTER — Encounter: Payer: Self-pay | Admitting: Physician Assistant

## 2019-07-25 DIAGNOSIS — W228XXA Striking against or struck by other objects, initial encounter: Secondary | ICD-10-CM | POA: Insufficient documentation

## 2019-07-25 DIAGNOSIS — R51 Headache: Secondary | ICD-10-CM | POA: Insufficient documentation

## 2019-07-25 DIAGNOSIS — Y999 Unspecified external cause status: Secondary | ICD-10-CM | POA: Insufficient documentation

## 2019-07-25 DIAGNOSIS — Y9229 Other specified public building as the place of occurrence of the external cause: Secondary | ICD-10-CM | POA: Diagnosis not present

## 2019-07-25 DIAGNOSIS — S0531XA Ocular laceration without prolapse or loss of intraocular tissue, right eye, initial encounter: Secondary | ICD-10-CM | POA: Diagnosis not present

## 2019-07-25 DIAGNOSIS — S058X1A Other injuries of right eye and orbit, initial encounter: Secondary | ICD-10-CM | POA: Diagnosis present

## 2019-07-25 DIAGNOSIS — Z79899 Other long term (current) drug therapy: Secondary | ICD-10-CM | POA: Diagnosis not present

## 2019-07-25 DIAGNOSIS — Y9389 Activity, other specified: Secondary | ICD-10-CM | POA: Insufficient documentation

## 2019-07-25 DIAGNOSIS — T709XXA Effect of air pressure and water pressure, unspecified, initial encounter: Secondary | ICD-10-CM

## 2019-07-25 LAB — CBC WITH DIFFERENTIAL/PLATELET
Abs Immature Granulocytes: 0.03 10*3/uL (ref 0.00–0.07)
Basophils Absolute: 0.1 10*3/uL (ref 0.0–0.1)
Basophils Relative: 1 %
Eosinophils Absolute: 0.1 10*3/uL (ref 0.0–0.5)
Eosinophils Relative: 2 %
HCT: 43.9 % (ref 39.0–52.0)
Hemoglobin: 14.1 g/dL (ref 13.0–17.0)
Immature Granulocytes: 0 %
Lymphocytes Relative: 18 %
Lymphs Abs: 1.6 10*3/uL (ref 0.7–4.0)
MCH: 25.8 pg — ABNORMAL LOW (ref 26.0–34.0)
MCHC: 32.1 g/dL (ref 30.0–36.0)
MCV: 80.3 fL (ref 80.0–100.0)
Monocytes Absolute: 0.5 10*3/uL (ref 0.1–1.0)
Monocytes Relative: 6 %
Neutro Abs: 6.6 10*3/uL (ref 1.7–7.7)
Neutrophils Relative %: 73 %
Platelets: 235 10*3/uL (ref 150–400)
RBC: 5.47 MIL/uL (ref 4.22–5.81)
RDW: 14.9 % (ref 11.5–15.5)
WBC: 8.9 10*3/uL (ref 4.0–10.5)
nRBC: 0 % (ref 0.0–0.2)

## 2019-07-25 LAB — BASIC METABOLIC PANEL
Anion gap: 9 (ref 5–15)
BUN: 24 mg/dL — ABNORMAL HIGH (ref 6–20)
CO2: 24 mmol/L (ref 22–32)
Calcium: 9.4 mg/dL (ref 8.9–10.3)
Chloride: 106 mmol/L (ref 98–111)
Creatinine, Ser: 1.29 mg/dL — ABNORMAL HIGH (ref 0.61–1.24)
GFR calc Af Amer: >60 mL/min (ref >60)
GFR calc non Af Amer: >60 mL/min (ref >60)
Glucose, Bld: 101 mg/dL — ABNORMAL HIGH (ref 70–99)
Potassium: 3.7 mmol/L (ref 3.5–5.1)
Sodium: 139 mmol/L (ref 135–145)

## 2019-07-25 MED ORDER — MORPHINE SULFATE (PF) 4 MG/ML IV SOLN
4.0000 mg | Freq: Once | INTRAVENOUS | Status: AC
  Administered 2019-07-25: 4 mg via INTRAVENOUS
  Filled 2019-07-25: qty 1

## 2019-07-25 MED ORDER — CEPHALEXIN 500 MG PO CAPS: 500.0000 mg | ORAL_CAPSULE | Freq: Three times a day (TID) | ORAL | 0 refills | Status: DC

## 2019-07-25 MED ORDER — HYDROCODONE-ACETAMINOPHEN 5-325 MG PO TABS: 1.0000 | ORAL_TABLET | Freq: Four times a day (QID) | ORAL | 0 refills | Status: AC | PRN

## 2019-07-25 MED ORDER — ONDANSETRON HCL 4 MG/2ML IJ SOLN
4.0000 mg | Freq: Once | INTRAMUSCULAR | Status: AC
  Administered 2019-07-25: 4 mg via INTRAVENOUS
  Filled 2019-07-25: qty 2

## 2019-07-25 MED ORDER — NEOMYCIN-POLYMYXIN-DEXAMETH 3.5-10000-0.1 OP OINT
TOPICAL_OINTMENT | Freq: Three times a day (TID) | OPHTHALMIC | Status: DC
  Administered 2019-07-25: 18:00:00 via OPHTHALMIC
  Filled 2019-07-25: qty 3.5

## 2019-07-25 MED ORDER — TETRACAINE HCL 0.5 % OP SOLN
2.0000 [drp] | Freq: Once | OPHTHALMIC | Status: AC
Start: 2019-07-25 — End: 2019-07-25
  Administered 2019-07-25: 2 [drp] via OPHTHALMIC
  Filled 2019-07-25: qty 4

## 2019-07-25 MED ORDER — ONDANSETRON 4 MG PO TBDP: 4.0000 mg | ORAL_TABLET | Freq: Three times a day (TID) | ORAL | 0 refills | Status: DC | PRN

## 2019-07-25 MED ORDER — CEPHALEXIN 500 MG PO CAPS
500.0000 mg | ORAL_CAPSULE | Freq: Once | ORAL | Status: AC
  Administered 2019-07-25: 500 mg via ORAL
  Filled 2019-07-25: qty 1

## 2019-07-25 MED ORDER — FLUORESCEIN SODIUM 1 MG OP STRP
1.0000 | ORAL_STRIP | Freq: Once | OPHTHALMIC | Status: AC
  Administered 2019-07-25: 1 via OPHTHALMIC
  Filled 2019-07-25: qty 1

## 2019-07-25 NOTE — ED Notes (Signed)
Maxitrol to provider at bedside.

## 2019-07-25 NOTE — ED Notes (Signed)
Pt to ct 

## 2019-07-25 NOTE — Discharge Instructions (Signed)
You have a laceration to the conjunctiva the white part of the eye. This will require repair with very small sutures in the office tomorrow. You will also be evaluated by a Retina Specialist, to ensure there is no need for further surgery to the retina within the eye. Use the eye drops as directed and take the antibiotic as directed. Rest with the head up and take the pain medicine as needed.

## 2019-07-25 NOTE — ED Notes (Signed)
Pt's R eye swollen shut after handle of pressure washer slipped and hit him in the eye. Pt unable to open eye currently d/t swelling and pain. Abrasion noted under eye. Bleeding controled. States blurry vision immediately after event and then couldn't open eye anymore. Lights dimmed again for pt. A&Ox4.

## 2019-07-25 NOTE — ED Provider Notes (Signed)
[facility] Emergency Department Provider Note ____________________________________________  Time seen: 1515  I have reviewed the triage vital signs and the nursing notes.  HISTORY  Chief Complaint  Eye Injury  HPI Richard Haynes is a 58 y.o. male presents to the ED, to by his supervisor from the worksite.  Patient describes an injury to the right eye while using a high-pressure washer.  Patient describes he was on his gloves, when he accidentally hit the soft trigger on the gun, and sustained injury to the right eye.  He believes he got both an injection of water as well as a contusion to the eye with the metal tip on the rubberized hose.  He presents now with significant eye pain, blurred vision, and bleeding from the periorbital region as well as the eyeball.  He denies any other injury at this time.  Patient is without any loss of consciousness, dizziness, vertigo.  He does report some mild headache pain as well as some nausea and fatigue.   He reports a current tetanus status at this time.  Past Medical History:  Diagnosis Date  . Inguinal hernia   . Rectal bleeding     There are no active problems to display for this patient.   Past Surgical History:  Procedure Laterality Date  . VASECTOMY  03/2001    Prior to Admission medications   Medication Sig Start Date End Date Taking? Authorizing Provider  albuterol (PROVENTIL HFA;VENTOLIN HFA) 108 (90 Base) MCG/ACT inhaler Inhale 2 puffs into the lungs every 4 (four) hours as needed for wheezing or shortness of breath. 11/02/18   Triplett, Cari B, FNP  cephALEXin (KEFLEX) 500 MG capsule Take 1 capsule (500 mg total) by mouth 3 (three) times daily. 07/25/19   Trinetta Alemu, Charlesetta IvoryJenise V Bacon, PA-C  HYDROcodone-acetaminophen (NORCO) 5-325 MG tablet Take 1 tablet by mouth every 6 (six) hours as needed for up to 12 days. 07/25/19 08/06/19  Kazimir Hartnett, Charlesetta IvoryJenise V Bacon, PA-C  ondansetron (ZOFRAN ODT) 4 MG disintegrating tablet Take 1  tablet (4 mg total) by mouth every 8 (eight) hours as needed. 07/25/19   Braelyn Bordonaro, Charlesetta IvoryJenise V Bacon, PA-C    Allergies Iodine, Iodinated diagnostic agents, and Shellfish allergy  History reviewed. No pertinent family history.  Social History Social History   Tobacco Use  . Smoking status: Never Smoker  . Smokeless tobacco: Never Used  Substance Use Topics  . Alcohol use: Yes    Comment: social  . Drug use: No    Review of Systems  Constitutional: Negative for fever. Eyes: Negative for visual changes. Right eye trauma ENT: Negative for sore throat. Cardiovascular: Negative for chest pain. Respiratory: Negative for shortness of breath. Gastrointestinal: Negative for abdominal pain, vomiting and diarrhea. Reports nausea Genitourinary: Negative for dysuria. Musculoskeletal: Negative for back pain. Skin: Negative for rash. Neurological: Negative for headaches, focal weakness or numbness. ____________________________________________  PHYSICAL EXAM:  VITAL SIGNS: ED Triage Vitals  Enc Vitals Group     BP 07/25/19 1311 (!) 143/85     Pulse Rate 07/25/19 1311 87     Resp 07/25/19 1311 18     Temp 07/25/19 1311 99.1 F (37.3 C)     Temp Source 07/25/19 1311 Oral     SpO2 07/25/19 1311 94 %     Weight 07/25/19 1313 245 lb (111.1 kg)     Height 07/25/19 1313 6' (1.829 m)     Head Circumference --      Peak Flow --  Pain Score 07/25/19 1313 10     Pain Loc --      Pain Edu? --      Excl. in [place]? --     Constitutional: Alert and oriented. Well appearing and in no distress. Head: Normocephalic and atraumatic, except for skin abrasion to the infraorbital region Eyes: Conjunctivae are injected, edematous, and hemorrhagic on the right. EOMs intact on the right. OD pupil is fixed and dilated. Remainder of focused eye exam deferred to ophthalmology Cardiovascular: Normal rate, regular rhythm. Normal distal pulses. Respiratory: Normal respiratory effort. No  wheezes/rales/rhonchi. Musculoskeletal: Nontender with normal range of motion in all extremities.  Neurologic:  Normal gait without ataxia. Normal speech and language. No gross focal neurologic deficits are appreciated. Skin:  Skin is warm, dry and intact. No rash noted. ____________________________________________   LABS (pertinent positives/negatives) Labs Reviewed  BASIC METABOLIC PANEL - Abnormal; Notable for the following components:      Result Value   Glucose, Bld 101 (*)    BUN 24 (*)    Creatinine, Ser 1.29 (*)    All other components within normal limits  CBC WITH DIFFERENTIAL/PLATELET - Abnormal; Notable for the following components:   MCH 25.8 (*)    All other components within normal limits  ____________________________________________   RADIOLOGY  CT Orbits w/o CM IMPRESSION: 1. Right orbit preseptal hematoma with extensive gas within the preseptal, retrobulbar, and periorbital soft tissues. Gas dissects into the subcutaneous infraorbital soft tissues. 2. No fracture identified. ____________________________________________  PROCEDURES  Tetracaine i gtt OD Morphine 4 mg IVP Zofran 4 mg IVP Maxitrol ophthalmic ointment Procedures  Visual Acuity OD* 20/60 ____________________________________________  INITIAL IMPRESSION / ASSESSMENT AND PLAN / ED COURSE  ----------------------------------------- 3:33 PM on 07/25/2019 -----------------------------------------  S/w Dr. Michelene Heady. Will see patient in the ED directly.  Patient with ED evaluation of sudden eye trauma secondary to high-pressure water injection.  Patient's CT scan of the orbits reveals air in the retrobulbar space but an intact orbit.  Slit-lamp exam performed epithelial reveals a large corneal laceration 5 o'clock position, and questionable cosmesis versus retinal detachment inferiorly.  After feeling the patient discussed typical course of repair of a conjunctival laceration.  The patient will opt  to have the conjunctival laceration.  In the outpatient setting tomorrow with Dr. Toya Smothers.  He will also be evaluated by retinal specialist in the office at that time.  Patient is discharged with empiric Keflex, Zofran, as well as Maxitrol ophthalmic ointment and hydrocodone.  Patient is discharged at this time to follow-up with Dr. Neta Mends as discussed refill he was given patient his business card as well as instructions for follow-up.  Patient verbalized understanding the discharge at this time  Richard Haynes was evaluated in Emergency Department on 07/25/2019 for the symptoms described in the history of present illness. He was evaluated in the context of the global COVID-19 pandemic, which necessitated consideration that the patient might be at risk for infection with the SARS-CoV-2 virus that causes COVID-19. Institutional protocols and algorithms that pertain to the evaluation of patients at risk for COVID-19 are in a state of rapid change based on information released by regulatory bodies including the CDC and federal and state organizations. These policies and algorithms were followed during the patient's care in the ED. ____________________________________________  FINAL CLINICAL IMPRESSION(S) / ED DIAGNOSES  Final diagnoses:  Traumatic effect of water pressure  Conjunctival laceration, right, initial encounter      Melvenia Needles, PA-C 07/25/19 1826  Arnaldo Natal, MD 07/25/19 2036

## 2019-07-25 NOTE — Consult Note (Signed)
Subjective:  around 1200 today pressure washer jet struck OD. Immediate pain and bleeding. Unable to open eye at the time, not sure about LOV. Presented to ED with right eye pain and beelding.  Objective: Vital signs in last 24 hours: Temp:  [99.1 F (37.3 C)] 99.1 F (37.3 C) (09/27 1311) Pulse Rate:  [87] 87 (09/27 1311) Resp:  [18] 18 (09/27 1311) BP: (143)/(85) 143/85 (09/27 1311) SpO2:  [94 %] 94 % (09/27 1311) Weight:  [111.1 kg] 111.1 kg (09/27 1313)    Exam: UCVA 20/60 OD 20/25 OS  NO APD. Mydriasis OD. Round pupil.             IOP 23 mmHg OD  17 mmHg OS              Motility fair with some pain on left and up gazes.            1 cm laceration/abrasion RLL 025 cm laceration RUL. No active hemorrhage.       SLE: Normal OS               There is a large conjunctival laceration extending from 10 o'clock down to 6 o'clock.  The conjunctiva was gently manipulated and this appears to be partial thickness. There is no uveal prolapse. No foreign body.             The cornea is intact and with out abrasion. The anterior chamber is normally formed with mild traumatic iritis and mydriasis.        DFE: Normal OS                 There is moderate commotio in the inferior mid-periphery. There are a couple of tiny intraretinal hemorrhages. There is significant commotio verus subretinal fluid in the inferior periphery. No break is seen.   CT Orbits:  I reviewed the images and the report. There is a preseptal hematoma. There is extensive tissue emphysema in the preseptal tissues and the orbit. The globes are intact. The optic nerves and muscles are normal.  Recent Labs    07/25/19 1544  WBC 8.9  HGB 14.1  HCT 43.9  NA 139  K 3.7  CL 106  CO2 24  BUN 24*  CREATININE 1.29*    Studies/Results: Ct Orbits Wo Contrast  Result Date: 07/25/2019 CLINICAL DATA:  Trauma to eye while using pressure washer. EXAM: CT ORBITS WITHOUT CONTRAST TECHNIQUE: Multidetector CT images were obtained  using the standard protocol without intravenous contrast. COMPARISON:  None FINDINGS: Orbits:  No blowout fracture identified. Right preseptal hematoma is identified. There is extensive gas within the right orbit including the preseptal soft tissues and within the retrobulbar fat along the posterior aspects of the globe. Gas is seen tracking into the surrounding infraorbital subcutaneous soft tissues. The right globe appears intact and the lens appears located. Symmetric appearance of the optic nerve and extraocular muscles of the right orbit. Visualized sinuses: The paranasal sinuses and visualized mastoid air cells are clear. Soft tissues: Negative. Limited intracranial: No significant or unexpected finding. IMPRESSION: 1. Right orbit preseptal hematoma with extensive gas within the preseptal, retrobulbar, and periorbital soft tissues. Gas dissects into the subcutaneous infraorbital soft tissues. 2. No fracture identified. Electronically Signed   By: Kerby Moors M.D.   On: 07/25/2019 16:28      Assessment/Plan: 1) Superficial lacerations RUL and RLL.Marland Kitchen Very shallow, will heal best by secondary intention. Apply maxitrol ointment TID. 2) Conjunctival laceration. Sclera intact.  Will repair tomorrow in the minor surgery room at [facility] under local anesthesia. Apply Maxitrol ung TID to eye. 3) Traumatic mydriasis and mild iritis. Maxitrol applied will help the iritis. 3) Retinal injury consisting of commotio and possible SRF inferiorly. Discussed with retinologist. She will see the patient at [facility] tomorrow to further evaluate the retina. IF retina surgery is recommended it can be done in the next few days.  All of these details were discussed with patient. He agrees to follow up with me in one day.  LOS: 0 days   Galen Manila 9/27/20204:41 PM

## 2019-07-25 NOTE — ED Notes (Signed)
Messaged pharm to send med to tube station 6 ED Flex.

## 2019-07-25 NOTE — ED Notes (Signed)
WC completed and taken to lab to await pickup from [place].

## 2019-07-25 NOTE — ED Triage Notes (Addendum)
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  Pt has ice pack to right eye at this time.  Pt states he tried to open his eye on the way here and it was blurry. Pt states he thinks there is something in his eye.  Bleeding controlled to right eye lid at this time, swelling noted as well.  Pt unable to open his eye at this time without having pain.  Filing under worker's comp

## 2019-07-25 NOTE — ED Notes (Signed)
Pt to cat scan

## 2020-01-24 ENCOUNTER — Ambulatory Visit: Payer: Self-pay | Attending: Internal Medicine

## 2020-01-24 DIAGNOSIS — Z23 Encounter for immunization: Secondary | ICD-10-CM

## 2020-01-24 NOTE — Progress Notes (Signed)
   Covid-19 Vaccination Clinic  Name:  Richard Haynes    MRN: [id number] DOB: 01/04/1961  01/24/2020  Mr. Richard Haynes was observed post Covid-19 immunization for 15 minutes without incident. He was provided with Vaccine Information Sheet and instruction to access the V-Safe system.   Mr. Richard Haynes was instructed to call 911 with any severe reactions post vaccine: Marland Kitchen Difficulty breathing  . Swelling of face and throat  . A fast heartbeat  . A bad rash all over body  . Dizziness and weakness   Immunizations Administered    Name Date Dose VIS Date Route   Pfizer COVID-19 Vaccine 01/24/2020  1:24 PM 0.3 mL 10/08/2019 Intramuscular   Manufacturer: [employer], [employer]   Lot: [id number]   NDC: [address]-9

## 2020-02-09 IMAGING — CR DG CHEST 2V
2 series · 2 of 2 positions shown · non-contrast
Comparison: Radiographs 12/14/2010.

CLINICAL DATA: Toxic inhalation.  Coughing and falling.

EXAM:
CHEST - 2 VIEW

[chest pa]
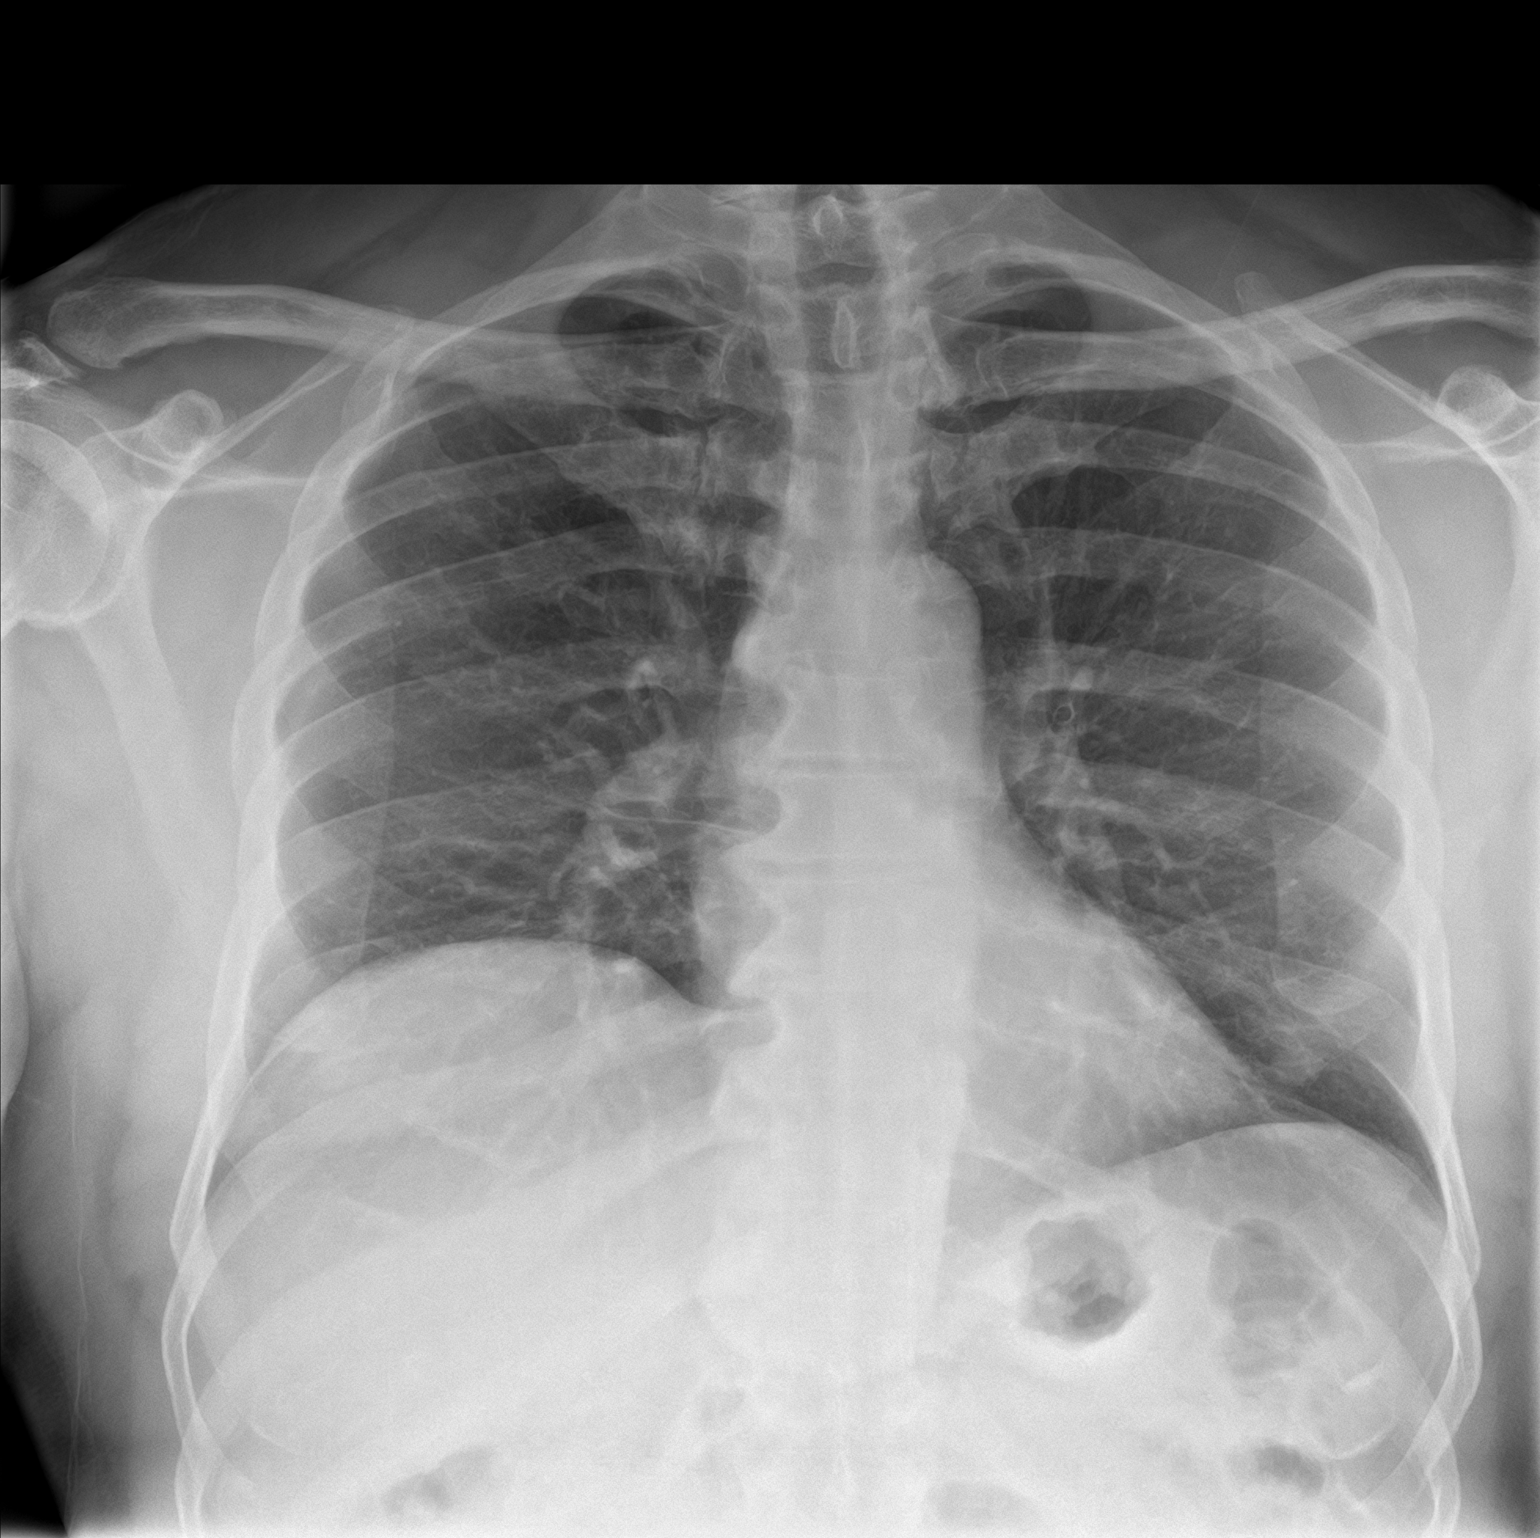

[chest lat]
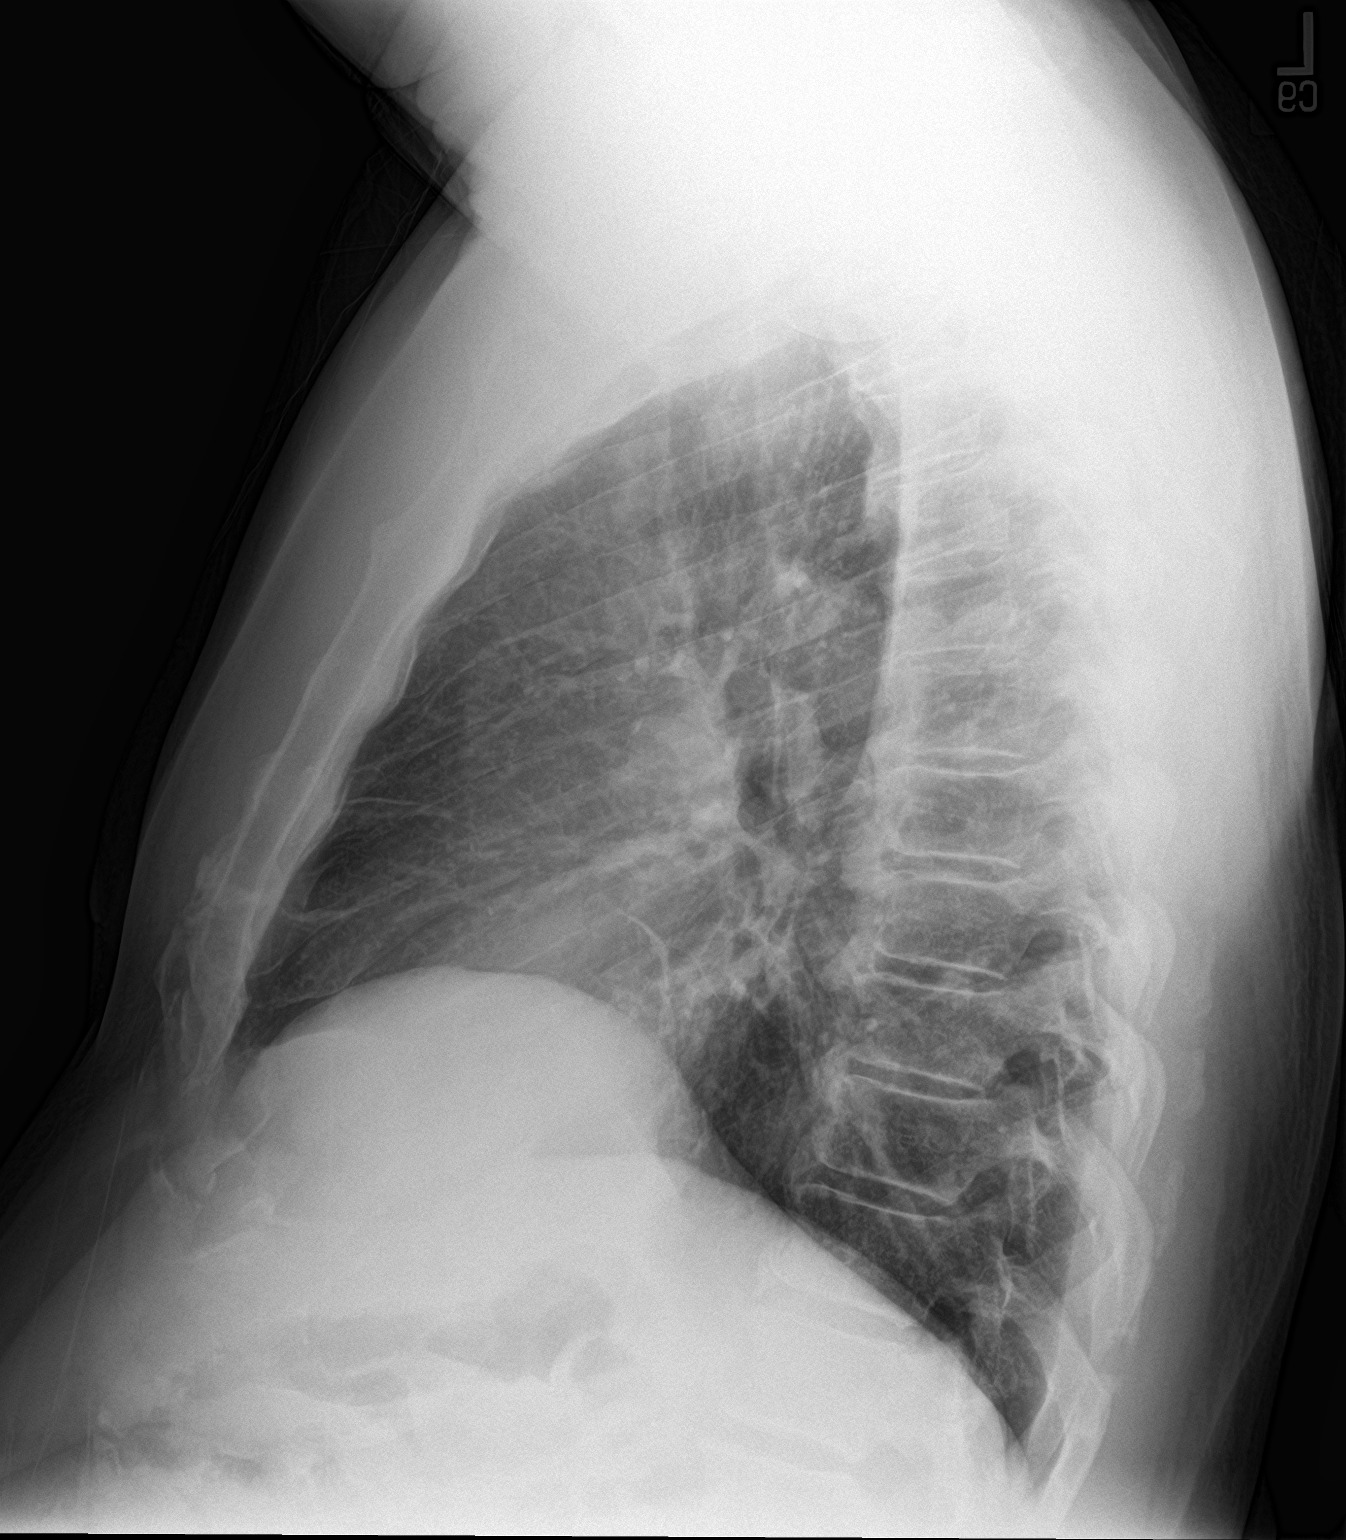

[2 of 2 positions shown; findings below may reference images not displayed]

FINDINGS: The heart size and mediastinal contours are stable. There is stable
anterior eventration of the right hemidiaphragm with adjacent linear
subsegmental atelectasis or scarring. The lungs are otherwise clear.
There is no pleural effusion or pneumothorax. Prominent nipple
shadows are noted bilaterally. There are degenerative changes
throughout the spine.
IMPRESSION: Stable chest.  No acute cardiopulmonary process.

## 2020-02-15 ENCOUNTER — Ambulatory Visit: Payer: Self-pay | Attending: Internal Medicine

## 2020-02-15 DIAGNOSIS — Z23 Encounter for immunization: Secondary | ICD-10-CM

## 2020-02-15 NOTE — Progress Notes (Signed)
   Covid-19 Vaccination Clinic  Name:  Richard Haynes    MRN: [id number] DOB: 11-06-1960  02/15/2020  [name] was observed post Covid-19 immunization for 15 minutes without incident. He was provided with Vaccine Information Sheet and instruction to access the V-Safe system.   [name] was instructed to call 911 with any severe reactions post vaccine: Marland Kitchen Difficulty breathing  . Swelling of face and throat  . A fast heartbeat  . A bad rash all over body  . Dizziness and weakness   Immunizations Administered    Name Date Dose VIS Date Route   Pfizer COVID-19 Vaccine 02/15/2020  2:54 PM 0.3 mL 12/22/2018 Intramuscular   Manufacturer: [employer], [employer]   Lot: [id number]   NDC: [address]-0

## 2020-11-07 ENCOUNTER — Other Ambulatory Visit: Payer: Self-pay | Admitting: Family Medicine

## 2020-11-08 LAB — CMP12+LP+TP+TSH+6AC+PSA+CBC…
ALT: 39 IU/L (ref 0–44)
AST: 36 IU/L (ref 0–40)
Albumin/Globulin Ratio: 1.2 (ref 1.2–2.2)
Albumin: 4.2 g/dL (ref 3.8–4.9)
Alkaline Phosphatase: 68 IU/L (ref 44–121)
BUN/Creatinine Ratio: 13 (ref 9–20)
BUN: 16 mg/dL (ref 6–24)
Basophils Absolute: 0.1 10*3/uL (ref 0.0–0.2)
Basos: 1 %
Bilirubin Total: 0.6 mg/dL (ref 0.0–1.2)
Calcium: 9.4 mg/dL (ref 8.7–10.2)
Chloride: 101 mmol/L (ref 96–106)
Chol/HDL Ratio: 4.8 ratio (ref 0.0–5.0)
Cholesterol, Total: 210 mg/dL — ABNORMAL HIGH (ref 100–199)
Creatinine, Ser: 1.23 mg/dL (ref 0.76–1.27)
EOS (ABSOLUTE): 0.2 10*3/uL (ref 0.0–0.4)
Eos: 3 %
Estimated CHD Risk: 1 times avg. (ref 0.0–1.0)
Free Thyroxine Index: 2.2 (ref 1.2–4.9)
GFR calc Af Amer: 75 mL/min/{1.73_m2} (ref >59)
GFR calc non Af Amer: 65 mL/min/{1.73_m2} (ref >59)
GGT: 44 IU/L (ref 0–65)
Globulin, Total: 3.5 g/dL (ref 1.5–4.5)
Glucose: 94 mg/dL (ref 65–99)
HDL: 44 mg/dL (ref >39)
Hematocrit: 47.3 % (ref 37.5–51.0)
Hemoglobin: 15.3 g/dL (ref 13.0–17.7)
Immature Grans (Abs): 0 10*3/uL (ref 0.0–0.1)
Immature Granulocytes: 0 %
Iron: 74 ug/dL (ref 38–169)
LDH: 233 IU/L — ABNORMAL HIGH (ref 121–224)
LDL Chol Calc (NIH): 143 mg/dL — ABNORMAL HIGH (ref 0–99)
Lymphocytes Absolute: 1.8 10*3/uL (ref 0.7–3.1)
Lymphs: 31 %
MCH: 25.9 pg — ABNORMAL LOW (ref 26.6–33.0)
MCHC: 32.3 g/dL (ref 31.5–35.7)
MCV: 80 fL (ref 79–97)
Monocytes Absolute: 0.4 10*3/uL (ref 0.1–0.9)
Monocytes: 7 %
Neutrophils Absolute: 3.4 10*3/uL (ref 1.4–7.0)
Neutrophils: 58 %
Phosphorus: 3.8 mg/dL (ref 2.8–4.1)
Platelets: 248 10*3/uL (ref 150–450)
Potassium: 4.1 mmol/L (ref 3.5–5.2)
Prostate Specific Ag, Serum: 1.4 ng/mL (ref 0.0–4.0)
RBC: 5.91 x10E6/uL — ABNORMAL HIGH (ref 4.14–5.80)
RDW: 13.7 % (ref 11.6–15.4)
Sodium: 138 mmol/L (ref 134–144)
T3 Uptake Ratio: 26 % (ref 24–39)
T4, Total: 8.6 ug/dL (ref 4.5–12.0)
TSH: 3.81 u[IU]/mL (ref 0.450–4.500)
Total Protein: 7.7 g/dL (ref 6.0–8.5)
Triglycerides: 126 mg/dL (ref 0–149)
Uric Acid: 6.1 mg/dL (ref 3.8–8.4)
VLDL Cholesterol Cal: 23 mg/dL (ref 5–40)
WBC: 5.8 10*3/uL (ref 3.4–10.8)

## 2022-05-06 DIAGNOSIS — R82998 Other abnormal findings in urine: Secondary | ICD-10-CM | POA: Diagnosis not present

## 2022-05-06 DIAGNOSIS — K4090 Unilateral inguinal hernia, without obstruction or gangrene, not specified as recurrent: Secondary | ICD-10-CM | POA: Diagnosis not present

## 2022-05-06 DIAGNOSIS — Z7689 Persons encountering health services in other specified circumstances: Secondary | ICD-10-CM | POA: Diagnosis not present

## 2022-05-09 ENCOUNTER — Ambulatory Visit: Payer: Self-pay | Admitting: Nurse Practitioner

## 2022-05-14 DIAGNOSIS — K4090 Unilateral inguinal hernia, without obstruction or gangrene, not specified as recurrent: Secondary | ICD-10-CM | POA: Diagnosis not present

## 2022-05-14 DIAGNOSIS — N400 Enlarged prostate without lower urinary tract symptoms: Secondary | ICD-10-CM | POA: Diagnosis not present

## 2022-05-16 ENCOUNTER — Ambulatory Visit: Payer: Self-pay | Admitting: Nurse Practitioner

## 2022-06-19 DIAGNOSIS — Z01810 Encounter for preprocedural cardiovascular examination: Secondary | ICD-10-CM | POA: Diagnosis not present

## 2022-06-19 DIAGNOSIS — I498 Other specified cardiac arrhythmias: Secondary | ICD-10-CM | POA: Diagnosis not present

## 2022-06-19 DIAGNOSIS — D176 Benign lipomatous neoplasm of spermatic cord: Secondary | ICD-10-CM | POA: Diagnosis not present

## 2022-06-19 DIAGNOSIS — K660 Peritoneal adhesions (postprocedural) (postinfection): Secondary | ICD-10-CM | POA: Diagnosis not present

## 2022-06-19 DIAGNOSIS — K4090 Unilateral inguinal hernia, without obstruction or gangrene, not specified as recurrent: Secondary | ICD-10-CM | POA: Diagnosis not present

## 2022-12-26 DIAGNOSIS — M9903 Segmental and somatic dysfunction of lumbar region: Secondary | ICD-10-CM | POA: Diagnosis not present

## 2022-12-26 DIAGNOSIS — M7918 Myalgia, other site: Secondary | ICD-10-CM | POA: Diagnosis not present

## 2022-12-26 DIAGNOSIS — M25551 Pain in right hip: Secondary | ICD-10-CM | POA: Diagnosis not present

## 2022-12-26 DIAGNOSIS — M9904 Segmental and somatic dysfunction of sacral region: Secondary | ICD-10-CM | POA: Diagnosis not present

## 2022-12-26 DIAGNOSIS — M5451 Vertebrogenic low back pain: Secondary | ICD-10-CM | POA: Diagnosis not present

## 2023-01-01 DIAGNOSIS — R059 Cough, unspecified: Secondary | ICD-10-CM | POA: Diagnosis not present

## 2023-01-01 DIAGNOSIS — J029 Acute pharyngitis, unspecified: Secondary | ICD-10-CM | POA: Diagnosis not present

## 2023-01-01 DIAGNOSIS — J069 Acute upper respiratory infection, unspecified: Secondary | ICD-10-CM | POA: Diagnosis not present

## 2023-01-23 ENCOUNTER — Ambulatory Visit (INDEPENDENT_AMBULATORY_CARE_PROVIDER_SITE_OTHER): Payer: 59 | Admitting: Nurse Practitioner

## 2023-01-23 ENCOUNTER — Encounter: Payer: Self-pay | Admitting: Nurse Practitioner

## 2023-01-23 VITALS — BP 120/78 | HR 84 | Temp 99.5°F | Resp 16 | Ht 71.75 in | Wt 242.1 lb

## 2023-01-23 DIAGNOSIS — Z125 Encounter for screening for malignant neoplasm of prostate: Secondary | ICD-10-CM

## 2023-01-23 DIAGNOSIS — E669 Obesity, unspecified: Secondary | ICD-10-CM | POA: Insufficient documentation

## 2023-01-23 DIAGNOSIS — R350 Frequency of micturition: Secondary | ICD-10-CM | POA: Insufficient documentation

## 2023-01-23 DIAGNOSIS — Z23 Encounter for immunization: Secondary | ICD-10-CM | POA: Diagnosis not present

## 2023-01-23 DIAGNOSIS — Z1211 Encounter for screening for malignant neoplasm of colon: Secondary | ICD-10-CM | POA: Diagnosis not present

## 2023-01-23 DIAGNOSIS — Z0000 Encounter for general adult medical examination without abnormal findings: Secondary | ICD-10-CM | POA: Insufficient documentation

## 2023-01-23 LAB — LIPID PANEL
Cholesterol: 193 mg/dL (ref 0–200)
HDL: 44 mg/dL (ref >39.00)
LDL Cholesterol: 117 mg/dL — ABNORMAL HIGH (ref 0–99)
NonHDL: 148.94
Total CHOL/HDL Ratio: 4
Triglycerides: 162 mg/dL — ABNORMAL HIGH (ref 0.0–149.0)
VLDL: 32.4 mg/dL (ref 0.0–40.0)

## 2023-01-23 LAB — URINALYSIS, MICROSCOPIC ONLY: RBC / HPF: NONE SEEN (ref 0–?)

## 2023-01-23 LAB — COMPREHENSIVE METABOLIC PANEL
ALT: 52 U/L (ref 0–53)
AST: 43 U/L — ABNORMAL HIGH (ref 0–37)
Albumin: 4 g/dL (ref 3.5–5.2)
Alkaline Phosphatase: 55 U/L (ref 39–117)
BUN: 21 mg/dL (ref 6–23)
CO2: 28 mEq/L (ref 19–32)
Calcium: 9.2 mg/dL (ref 8.4–10.5)
Chloride: 106 mEq/L (ref 96–112)
Creatinine, Ser: 1.29 mg/dL (ref 0.40–1.50)
GFR: 60.68 mL/min (ref >60.00)
Glucose, Bld: 94 mg/dL (ref 70–99)
Potassium: 4 mEq/L (ref 3.5–5.1)
Sodium: 140 mEq/L (ref 135–145)
Total Bilirubin: 0.5 mg/dL (ref 0.2–1.2)
Total Protein: 6.5 g/dL (ref 6.0–8.3)

## 2023-01-23 LAB — CBC
HCT: 45.5 % (ref 39.0–52.0)
Hemoglobin: 14.7 g/dL (ref 13.0–17.0)
MCHC: 32.3 g/dL (ref 30.0–36.0)
MCV: 81.7 fl (ref 78.0–100.0)
Platelets: 254 10*3/uL (ref 150.0–400.0)
RBC: 5.57 Mil/uL (ref 4.22–5.81)
RDW: 14.8 % (ref 11.5–15.5)
WBC: 7.4 10*3/uL (ref 4.0–10.5)

## 2023-01-23 LAB — TSH: TSH: 3 u[IU]/mL (ref 0.35–5.50)

## 2023-01-23 LAB — HEMOGLOBIN A1C: Hgb A1c MFr Bld: 6.1 % (ref 4.6–6.5)

## 2023-01-23 LAB — PSA: PSA: 1.67 ng/mL (ref 0.10–4.00)

## 2023-01-23 NOTE — Assessment & Plan Note (Signed)
Discussed age-appropriate immunizations and screening exams.  Did review patient's personal, surgical, family, social histories.  Patient is up-to-date on his tetanus vaccine and flu vaccine.  We did administer the first shingles vaccine today.  Patient given information at discharge about preventative healthcare maintenance with disparate guidance for his age range.  Ambulatory referral for CRC screening as he had several polyps removed at his last 1 in 2016.  PSA and DRE done today.

## 2023-01-23 NOTE — Patient Instructions (Signed)
Nice to see you today I will be in touch with the labs once I have them Follow up with me in 1 year for your next physical and labs  You will need to schedule a nurse visit in 3 months to get your second shingles vaccine

## 2023-01-23 NOTE — Assessment & Plan Note (Signed)
Patient states he was told he had an enlarged prostate urinary frequency likely secondary to that.  Prostate exam felt normal in office pending PSA and urine microscopy.

## 2023-01-23 NOTE — Assessment & Plan Note (Signed)
Patient is working on at home and doing healthy lifestyle modifications continue pending lipids and A1c

## 2023-01-23 NOTE — Progress Notes (Signed)
New Patient Office Visit  Subjective    Patient ID: Richard Haynes, male    DOB: 1961-04-03  Age: 62 y.o. MRN: [id number]  CC:  Chief Complaint  Patient presents with   Establish Care   Annual Exam    HPI Richard Haynes presents to establish care  for complete physical and follow up of chronic conditions.  Immunizations: -Tetanus: Completed in 2016 -Influenza: UTD -Shingles:  First vaccine series today -Pneumonia: Too young -covid: Original 2 shot series  Diet: Fair diet. States taht he will eat 2 meals a day. Some snacks. Cutting back on soda. Drinking water.  States that his place of appointment he does get free samples that is at work so he tries to avoid that. Exercise: No regular exercise. States he is doing weights at home.   Eye exam: Reading glasses Dental exam: Needs updating   Colonoscopy: Completed in [place] [facility] 09/20/2015,recall 5-10 years  Lung Cancer Screening: NA  PSA: Due, states that he was tod that he has enlarged prostate  Sleep: states that he works 9pm-530am. States that he does not sleep well.    Outpatient Encounter Medications as of 01/23/2023  Medication Sig   albuterol (PROVENTIL HFA;VENTOLIN HFA) 108 (90 Base) MCG/ACT inhaler Inhale 2 puffs into the lungs every 4 (four) hours as needed for wheezing or shortness of breath. (Patient not taking: Reported on 01/23/2023)   cephALEXin (KEFLEX) 500 MG capsule Take 1 capsule (500 mg total) by mouth 3 (three) times daily. (Patient not taking: Reported on 01/23/2023)   ondansetron (ZOFRAN ODT) 4 MG disintegrating tablet Take 1 tablet (4 mg total) by mouth every 8 (eight) hours as needed. (Patient not taking: Reported on 01/23/2023)   No facility-administered encounter medications on file as of 01/23/2023.    Past Medical History:  Diagnosis Date   Inguinal hernia    Rectal bleeding     Past Surgical History:  Procedure Laterality Date   HERNIA REPAIR Right    2023   VASECTOMY   03/28/2001    Family History  Problem Relation Age of Onset   Diabetes Mother    Parkinson's disease Mother    Dementia Mother     Social History   Socioeconomic History   Marital status: Legally Separated    Spouse name: Not on file   Number of children: 3   Years of education: Not on file   Highest education level: Not on file  Occupational History   Not on file  Tobacco Use   Smoking status: Never   Smokeless tobacco: Never  Vaping Use   Vaping Use: Former   Substances: Flavoring  Substance and Sexual Activity   Alcohol use: Not Currently    Comment: social   Drug use: No   Sexual activity: Yes    Birth control/protection: Surgical  Other Topics Concern   Not on file  Social History Narrative   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  Respiratory:  Negative for shortness  of breath.   Cardiovascular:  Negative for chest pain and leg swelling.  Gastrointestinal:  Negative for abdominal pain, blood in stool, constipation, diarrhea, nausea and vomiting.       Bm daily   Genitourinary:  Positive for frequency. Negative for dysuria and hematuria.  Neurological:  Negative for tingling and headaches.  Psychiatric/Behavioral:  Negative for hallucinations and suicidal ideas.         Objective    BP 120/78   Pulse 84   Temp 99.5 F (37.5 C)   Resp 16   Ht 5' 11.75" (1.822 m)   Wt 242 lb 2 oz (109.8 kg)   SpO2 97%   BMI 33.07 kg/m   Physical Exam Vitals and nursing note reviewed. Exam conducted with a chaperone present ([place]).   Constitutional:      Appearance: Normal appearance.  HENT:     Right Ear: Tympanic membrane, ear canal and external ear normal.     Left Ear: Tympanic membrane, ear canal and external ear normal.     Mouth/Throat:     Mouth: Mucous membranes are moist.     Pharynx: Oropharynx is clear.  Eyes:     Extraocular Movements: Extraocular movements intact.     Comments: Right pupil approx 4-61mm and non reactive due to eye injury in the past   Cardiovascular:     Rate and Rhythm: Normal rate and regular rhythm.     Pulses: Normal pulses.     Heart sounds: Normal heart sounds.  Pulmonary:     Effort: Pulmonary effort is normal.     Breath sounds: Normal breath sounds.  Abdominal:     General: Bowel sounds are normal. There is no distension.     Palpations: There is no mass.     Tenderness: There is no abdominal tenderness.     Hernia: No hernia is present.     Comments: Rectus diastasis  Genitourinary:    Prostate: Normal.     Rectum: Normal.  Musculoskeletal:     Right lower leg: No edema.     Left lower leg: No edema.  Lymphadenopathy:     Cervical: No cervical adenopathy.  Skin:    General: Skin is warm.  Neurological:     General: No focal deficit present.     Mental Status: He is alert.     Deep Tendon Reflexes:     Reflex Scores:      Bicep reflexes are 2+ on the right side and 2+ on the left side.      Patellar reflexes are 2+ on the right side and 2+ on the left side.    Comments: Bilateral upper and lower extremity strength 5/5  Psychiatric:        Mood and Affect: Mood normal.        Behavior: Behavior normal.        Thought Content: Thought content normal.        Judgment: Judgment normal.         Assessment & Plan:   Problem List Items Addressed This Visit       Other   Preventative health care - Primary    Discussed age-appropriate immunizations and screening exams.  Did review patient's personal, surgical, family, social histories.  Patient is  up-to-date on his tetanus vaccine and flu vaccine.  We did administer the first shingles vaccine today.  Patient given information at discharge about preventative healthcare maintenance with disparate guidance for his age range.  Ambulatory referral for CRC screening as he had several polyps removed at his last 1 in 2016.  PSA and DRE done today.      Relevant Orders   CBC   Comprehensive metabolic panel   TSH   Obesity (BMI 30-39.9)    Patient is working on at home and doing healthy lifestyle modifications continue pending lipids and A1c      Relevant Orders   Hemoglobin A1c   Lipid panel   Urinary frequency    Patient states he was told he had an enlarged prostate urinary frequency likely secondary to that.  Prostate exam felt normal in office pending PSA and urine microscopy.      Relevant Orders   Urine Microscopic   Other Visit Diagnoses     Screening for prostate cancer       Relevant Orders   PSA   Need for shingles vaccine       Relevant Orders   Varicella-zoster vaccine IM (Completed)   Screening for colon cancer       Relevant Orders   Ambulatory referral to Gastroenterology       Return in about 1 year (around 01/23/2024) for CPE and Labs.   Romilda Garret, NP

## 2023-01-29 ENCOUNTER — Encounter: Payer: Self-pay | Admitting: *Deleted

## 2023-02-03 ENCOUNTER — Telehealth: Payer: Self-pay | Admitting: Nurse Practitioner

## 2023-02-03 ENCOUNTER — Telehealth: Payer: Self-pay

## 2023-02-03 DIAGNOSIS — R350 Frequency of micturition: Secondary | ICD-10-CM

## 2023-02-03 DIAGNOSIS — Z1211 Encounter for screening for malignant neoplasm of colon: Secondary | ICD-10-CM

## 2023-02-03 MED ORDER — TAMSULOSIN HCL 0.4 MG PO CAPS: 0.4000 mg | ORAL_CAPSULE | Freq: Every day | ORAL | 0 refills | Status: DC

## 2023-02-03 NOTE — Telephone Encounter (Signed)
-----   Message from Vertis Kelch, [place] sent at 02/03/2023 10:16 AM EDT ----- Called patient reviewed all information and repeated back to me. Will call if any questions.  Pt said he wants to get started on something to help with the urinary frequency. Please send to [facility] on [address].

## 2023-02-03 NOTE — Telephone Encounter (Signed)
Patient calling to schedule colonoscopy.  

## 2023-02-03 NOTE — Telephone Encounter (Signed)
Medication sent to pharmacy of choice.

## 2023-02-04 ENCOUNTER — Other Ambulatory Visit: Payer: Self-pay

## 2023-02-04 DIAGNOSIS — Z1211 Encounter for screening for malignant neoplasm of colon: Secondary | ICD-10-CM

## 2023-02-04 MED ORDER — NA SULFATE-K SULFATE-MG SULF 17.5-3.13-1.6 GM/177ML PO SOLN: 1.0000 | Freq: Once | ORAL | 0 refills | Status: AC

## 2023-02-04 NOTE — Telephone Encounter (Signed)
Gastroenterology Pre-Procedure Review  Request Date: 02/14/23 Requesting Physician: Dr. Servando Snare  PATIENT REVIEW QUESTIONS: The patient responded to the following health history questions as indicated:    1. Are you having any GI issues? no 2. Do you have a personal history of Polyps? no 3. Do you have a family history of Colon Cancer or Polyps? no 4. Diabetes Mellitus? no 5. Joint replacements in the past 12 months?no 6. Major health problems in the past 3 months?no 7. Any artificial heart valves, MVP, or defibrillator?no    MEDICATIONS & ALLERGIES:    Patient reports the following regarding taking any anticoagulation/antiplatelet therapy:   Plavix, Coumadin, Eliquis, Xarelto, Lovenox, Pradaxa, Brilinta, or Effient? no Aspirin? no  Patient confirms/reports the following medications:  Current Outpatient Medications  Medication Sig Dispense Refill   albuterol (PROVENTIL HFA;VENTOLIN HFA) 108 (90 Base) MCG/ACT inhaler Inhale 2 puffs into the lungs every 4 (four) hours as needed for wheezing or shortness of breath. (Patient not taking: Reported on 01/23/2023) 1 Inhaler 1   cephALEXin (KEFLEX) 500 MG capsule Take 1 capsule (500 mg total) by mouth 3 (three) times daily. (Patient not taking: Reported on 01/23/2023) 21 capsule 0   ondansetron (ZOFRAN ODT) 4 MG disintegrating tablet Take 1 tablet (4 mg total) by mouth every 8 (eight) hours as needed. (Patient not taking: Reported on 01/23/2023) 15 tablet 0   tamsulosin (FLOMAX) 0.4 MG CAPS capsule Take 1 capsule (0.4 mg total) by mouth daily. 90 capsule 0   No current facility-administered medications for this visit.    Patient confirms/reports the following allergies:  Allergies  Allergen Reactions   Iodine Hives   Iodinated Contrast Media Rash   Shellfish Allergy Rash    No orders of the defined types were placed in this encounter.   AUTHORIZATION INFORMATION Primary Insurance: 1D#: Group #:  Secondary Insurance: 1D#: Group  #:  SCHEDULE INFORMATION: Date: 02/14/23 Time: Location: [facility]

## 2023-02-04 NOTE — Addendum Note (Signed)
Addended by: Avie Arenas on: 02/04/2023 09:27 AM   Modules accepted: Orders

## 2023-02-11 ENCOUNTER — Encounter: Payer: Self-pay | Admitting: Gastroenterology

## 2023-02-12 NOTE — Anesthesia Preprocedure Evaluation (Addendum)
Anesthesia Evaluation  Patient identified by MRN, date of birth, ID band Patient awake    Reviewed: Allergy & Precautions, H&P , NPO status , Patient's Chart, lab work & pertinent test results  Airway Mallampati: III  TM Distance: >3 FB Neck ROM: Full    Dental   Large chip left upper central incisor Has piercing lower lip, cannot get it out, discussed possible risks, will use caution :   Pulmonary neg pulmonary ROS   Pulmonary exam normal breath sounds clear to auscultation       Cardiovascular negative cardio ROS Normal cardiovascular exam Rhythm:Regular Rate:Normal     Neuro/Psych negative neurological ROS  negative psych ROS   GI/Hepatic negative GI ROS, Neg liver ROS,,,  Endo/Other  negative endocrine ROS    Renal/GU negative Renal ROS  negative genitourinary   Musculoskeletal negative musculoskeletal ROS (+)    Abdominal   Peds negative pediatric ROS (+)  Hematology negative hematology ROS (+)   Anesthesia Other Findings Hx rectal bleeding Hx bronchitis Hx decreased GFR  Reproductive/Obstetrics negative OB ROS                             Anesthesia Physical Anesthesia Plan  ASA: 2  Anesthesia Plan: General   Post-op Pain Management:    Induction: Intravenous  PONV Risk Score and Plan:   Airway Management Planned: Natural Airway and Nasal Cannula  Additional Equipment:   Intra-op Plan:   Post-operative Plan:   Informed Consent: I have reviewed the patients History and Physical, chart, labs and discussed the procedure including the risks, benefits and alternatives for the proposed anesthesia with the patient or authorized representative who has indicated his/her understanding and acceptance.     Dental Advisory Given  Plan Discussed with: Anesthesiologist, CRNA and Surgeon  Anesthesia Plan Comments: (Patient consented for risks of anesthesia including but not  limited to:  - adverse reactions to medications - risk of airway placement if required - damage to eyes, teeth, lips or other oral mucosa - nerve damage due to positioning  - sore throat or hoarseness - Damage to heart, brain, nerves, lungs, other parts of body or loss of life  Patient voiced understanding.)       Anesthesia Quick Evaluation

## 2023-02-14 ENCOUNTER — Ambulatory Visit: Payer: 59 | Admitting: Anesthesiology

## 2023-02-14 ENCOUNTER — Ambulatory Visit
Admission: RE | Admit: 2023-02-14 | Discharge: 2023-02-14 | Disposition: A | Discharge status: Home / Self Care | Payer: 59 | Attending: Gastroenterology | Admitting: Gastroenterology

## 2023-02-14 ENCOUNTER — Encounter: Payer: Self-pay | Admitting: Gastroenterology

## 2023-02-14 ENCOUNTER — Encounter
Admission: RE | Disposition: A | Discharge status: Home / Self Care | Payer: Self-pay | Source: Home / Self Care | Attending: Gastroenterology

## 2023-02-14 ENCOUNTER — Other Ambulatory Visit: Payer: Self-pay

## 2023-02-14 DIAGNOSIS — Z860100 Personal history of colon polyps, unspecified: Secondary | ICD-10-CM

## 2023-02-14 DIAGNOSIS — K641 Second degree hemorrhoids: Secondary | ICD-10-CM | POA: Diagnosis not present

## 2023-02-14 DIAGNOSIS — Z1211 Encounter for screening for malignant neoplasm of colon: Secondary | ICD-10-CM | POA: Diagnosis not present

## 2023-02-14 DIAGNOSIS — K5730 Diverticulosis of large intestine without perforation or abscess without bleeding: Secondary | ICD-10-CM | POA: Diagnosis not present

## 2023-02-14 HISTORY — PX: COLONOSCOPY WITH PROPOFOL: SHX5780

## 2023-02-14 SURGERY — COLONOSCOPY WITH PROPOFOL
Anesthesia: General | Site: Rectum

## 2023-02-14 MED ORDER — LACTATED RINGERS IV SOLN: INTRAVENOUS | Status: DC

## 2023-02-14 MED ORDER — PROPOFOL 10 MG/ML IV BOLUS
INTRAVENOUS | Status: DC | PRN
  Administered 2023-02-14 (×2): 50 mg via INTRAVENOUS
  Administered 2023-02-14: 100 mg via INTRAVENOUS
  Administered 2023-02-14: 50 mg via INTRAVENOUS

## 2023-02-14 MED ORDER — SODIUM CHLORIDE 0.9 % IV SOLN: INTRAVENOUS | Status: DC

## 2023-02-14 SURGICAL SUPPLY — 21 items

## 2023-02-14 NOTE — Op Note (Signed)
[facility] Gastroenterology Patient Name: Richard Haynes Procedure Date: 02/14/2023 8:46 AM MRN: [id number] Account #: [id number] Date of Birth: [birth year] Admit Type: Outpatient Age: 62 Room: [facility] OR ROOM 01 Gender: Male Note Status: Finalized Instrument Name: [id number] Procedure:             Colonoscopy Indications:           High risk colon cancer surveillance: Personal history                         of colonic polyps Providers:             Midge Minium MD, MD Referring MD:          Genene Churn. Cable (Referring MD) Medicines:             Propofol per Anesthesia Complications:         No immediate complications. Procedure:             Pre-Anesthesia Assessment:                        - Prior to the procedure, a History and Physical was                         performed, and patient medications and allergies were                         reviewed. The patient's tolerance of previous                         anesthesia was also reviewed. The risks and benefits                         of the procedure and the sedation options and risks                         were discussed with the patient. All questions were                         answered, and informed consent was obtained. Prior                         Anticoagulants: The patient has taken no anticoagulant                         or antiplatelet agents. ASA Grade Assessment: II - A                         patient with mild systemic disease. After reviewing                         the risks and benefits, the patient was deemed in                         satisfactory condition to undergo the procedure.                        After obtaining informed consent, the colonoscope was  passed under direct vision. Throughout the procedure,                         the patient's blood pressure, pulse, and oxygen                         saturations were monitored continuously. The                          Colonoscope was introduced through the anus and                         advanced to the the cecum, identified by appendiceal                         orifice and ileocecal valve. The colonoscopy was                         performed without difficulty. The patient tolerated                         the procedure well. The quality of the bowel                         preparation was excellent. Findings:      The perianal and digital rectal examinations were normal.      Multiple small-mouthed diverticula were found in the sigmoid colon.      Non-bleeding internal hemorrhoids were found during retroflexion. The       hemorrhoids were Grade II (internal hemorrhoids that prolapse but reduce       spontaneously). Impression:            - Diverticulosis in the sigmoid colon.                        - Non-bleeding internal hemorrhoids.                        - No specimens collected. Recommendation:        - Discharge patient to home.                        - Resume previous diet.                        - Continue present medications.                        - Repeat colonoscopy in 7 years for surveillance. Procedure Code(s):     --- Professional ---                        [phone], Colonoscopy, flexible; diagnostic, including                         collection of specimen(s) by brushing or washing, when                         performed (separate procedure) Diagnosis Code(s):     --- Professional ---  Z86.010, Personal history of colonic polyps CPT copyright 2022 American Medical Association. All rights reserved. The codes documented in this report are preliminary and upon coder review may  be revised to meet current compliance requirements. Midge Minium MD, MD 02/14/2023 9:18:25 AM This report has been signed electronically. Number of Addenda: 0 Note Initiated On: 02/14/2023 8:46 AM Scope Withdrawal Time: 0 hours 6 minutes 40 seconds  Total Procedure Duration: 0 hours 17  minutes 47 seconds  Estimated Blood Loss:  Estimated blood loss: none.      [facility]

## 2023-02-14 NOTE — Anesthesia Postprocedure Evaluation (Signed)
Anesthesia Post Note  Patient: Richard Haynes  Procedure(s) Performed: COLONOSCOPY WITH PROPOFOL (Rectum)  Patient location during evaluation: PACU Anesthesia Type: General Level of consciousness: awake and alert Pain management: pain level controlled Vital Signs Assessment: post-procedure vital signs reviewed and stable Respiratory status: spontaneous breathing, nonlabored ventilation, respiratory function stable and patient connected to nasal cannula oxygen Cardiovascular status: blood pressure returned to baseline and stable Postop Assessment: no apparent nausea or vomiting Anesthetic complications: no   No notable events documented.   Last Vitals:  Vitals:   02/14/23 0918 02/14/23 0927  BP: 122/73 117/76  Pulse: 73 75  Resp: 14   Temp: (!) 36.3 C (!) 36.3 C  SpO2: 95% 95%    Last Pain:  Vitals:   02/14/23 0927  TempSrc:   PainSc: 0-No pain                 Marisue Humble

## 2023-02-14 NOTE — H&P (Signed)
Midge Minium, MD [facility] [address], [address] [place], [place] [address] [phone] Fax : [phone]  Primary Care Physician:  Eden Emms, NP Primary Gastroenterologist:  Dr. Servando Snare  Pre-Procedure History & Physical: HPI:  Richard Haynes is a 62 y.o. male is here for an colonoscopy.   Past Medical History:  Diagnosis Date   Inguinal hernia    Rectal bleeding     Past Surgical History:  Procedure Laterality Date   HERNIA REPAIR Right    2023   VASECTOMY  03/28/2001    Prior to Admission medications   Medication Sig Start Date End Date Taking? Authorizing Provider  Ascorbic Acid (VITAMIN C) 1000 MG tablet Take 1,000 mg by mouth daily.   Yes [provider]  cholecalciferol (VITAMIN D3) 25 MCG (1000 UNIT) tablet Take 1,000 Units by mouth daily.   Yes [provider]  cyanocobalamin (VITAMIN B12) 1000 MCG tablet Take 1,000 mcg by mouth daily.   Yes [provider]  Magnesium 250 MG TABS Take by mouth daily.   Yes [provider]  Multiple Vitamins-Minerals (CENTRUM MEN) TABS Take by mouth daily.   Yes [provider]  Probiotic Product (PROBIOTIC DAILY PO) Take by mouth daily.   Yes [provider]  pyridOXINE (VITAMIN B6) 100 MG tablet Take 100 mg by mouth daily.   Yes [provider]  selenium ([facility] SELENIUM) 200 MCG TABS tablet Take 200 mcg by mouth daily.   Yes [provider]  tamsulosin (FLOMAX) 0.4 MG CAPS capsule Take 1 capsule (0.4 mg total) by mouth daily. 02/03/23  Yes Eden Emms, NP  zinc gluconate 50 MG tablet Take 50 mg by mouth daily.   Yes [provider]    Allergies as of 02/04/2023 - Review Complete 01/23/2023  Allergen Reaction Noted   Iodine Hives 08/13/2015   Iodinated contrast media Rash 08/16/2015   Shellfish allergy Rash 08/16/2015    Family History  Problem Relation Age of Onset   Diabetes Mother    Parkinson's disease Mother    Dementia Mother      Social History   Socioeconomic History   Marital status: Legally Separated    Spouse name: Not on file   Number of children: 3   Years of education: Not on file   Highest education level: Not on file  Occupational History   Not on file  Tobacco Use   Smoking status: Never   Smokeless tobacco: Never  Vaping Use   Vaping Use: Former   Substances: Flavoring  Substance and Sexual Activity   Alcohol use: Not Currently    Comment: social   Drug use: No   Sexual activity: Yes    Birth control/protection: Surgical  Other Topics Concern   Not on file  Social History Narrative   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      2 living kids and one daughter that SI      Hobbies: draw and tattoo   Social Determinants of Health   Financial Resource Strain: Not on file  Food Insecurity: Not on file  Transportation Needs: Not on file  Physical Activity: Not on file  Stress: Not on file  Social Connections: Not on file  Intimate Partner Violence: Not on file    Review of Systems: See HPI, otherwise negative ROS  Physical Exam: BP 122/88   Pulse 74   Temp 98 F (36.7 C) (Temporal)   Resp 16   Ht 5' 11.75" (  1.822 m)   Wt 110.2 kg   SpO2 99%   BMI 33.19 kg/m  General:   Alert,  pleasant and cooperative in NAD Head:  Normocephalic and atraumatic. Neck:  Supple; no masses or thyromegaly. Lungs:  Clear throughout to auscultation.    Heart:  Regular rate and rhythm. Abdomen:  Soft, nontender and nondistended. Normal bowel sounds, without guarding, and without rebound.   Neurologic:  Alert and  oriented x4;  grossly normal neurologically.  Impression/Plan: Richard Haynes is here for an colonoscopy to be performed for a history of adenomatous polyps on 2016   Risks, benefits, limitations, and alternatives regarding  colonoscopy have been reviewed with the patient.  Questions have been answered.  All parties agreeable.   Midge Minium, MD  02/14/2023, 8:22 AM

## 2023-02-14 NOTE — Transfer of Care (Signed)
Immediate Anesthesia Transfer of Care Note  Patient: Richard Haynes  Procedure(s) Performed: COLONOSCOPY WITH PROPOFOL (Rectum)  Patient Location: PACU  Anesthesia Type: General  Level of Consciousness: awake, alert  and patient cooperative  Airway and Oxygen Therapy: Patient Spontanous Breathing and Patient connected to supplemental oxygen  Post-op Assessment: Post-op Vital signs reviewed, Patient's Cardiovascular Status Stable, Respiratory Function Stable, Patent Airway and No signs of Nausea or vomiting  Post-op Vital Signs: Reviewed and stable  Complications: No notable events documented.

## 2023-02-17 ENCOUNTER — Encounter: Payer: Self-pay | Admitting: Gastroenterology

## 2023-05-02 ENCOUNTER — Other Ambulatory Visit: Payer: Self-pay | Admitting: Nurse Practitioner

## 2023-05-02 DIAGNOSIS — R350 Frequency of micturition: Secondary | ICD-10-CM

## 2023-06-12 ENCOUNTER — Ambulatory Visit (INDEPENDENT_AMBULATORY_CARE_PROVIDER_SITE_OTHER): Payer: 59 | Admitting: Family Medicine

## 2023-06-12 ENCOUNTER — Encounter: Payer: Self-pay | Admitting: Family Medicine

## 2023-06-12 VITALS — BP 122/70 | HR 78 | Temp 97.4°F | Ht 71.75 in | Wt 233.5 lb

## 2023-06-12 DIAGNOSIS — R350 Frequency of micturition: Secondary | ICD-10-CM | POA: Diagnosis not present

## 2023-06-12 DIAGNOSIS — U071 COVID-19: Secondary | ICD-10-CM | POA: Diagnosis not present

## 2023-06-12 DIAGNOSIS — R0989 Other specified symptoms and signs involving the circulatory and respiratory systems: Secondary | ICD-10-CM

## 2023-06-12 LAB — POC COVID19 BINAXNOW: SARS Coronavirus 2 Ag: POSITIVE — AB

## 2023-06-12 MED ORDER — NIRMATRELVIR/RITONAVIR (PAXLOVID)TABLET: 3.0000 | ORAL_TABLET | Freq: Two times a day (BID) | ORAL | 0 refills | Status: AC

## 2023-06-12 MED ORDER — TAMSULOSIN HCL 0.4 MG PO CAPS
0.4000 mg | ORAL_CAPSULE | Freq: Every day | ORAL | 2 refills | Status: AC
Start: 2023-06-12 — End: ?

## 2023-06-12 NOTE — Progress Notes (Signed)
Patient ID: Richard Haynes, male    DOB: 09/28/1961, 62 y.o.   MRN: [id number]  This visit was conducted in person.  BP 122/70 (BP Location: Left Arm, Patient Position: Sitting, Cuff Size: Large)   Pulse 78   Temp (!) 97.4 F (36.3 C) (Temporal)   Ht 5' 11.75" (1.822 m)   Wt 233 lb 8 oz (105.9 kg)   SpO2 98%   BMI 31.89 kg/m    CC:  Chief Complaint  Patient presents with   Cough   Nasal Congestion   Chills    Subjective:   HPI: Richard Haynes is a 62 y.o. male with obesity presenting on 06/12/2023 for Cough, Nasal Congestion, and Chills   Date of onset:  2 days Initial symptoms included  nausea, diarrhea, chills Symptoms progressed to nasal congestion and congestion  No SOB, NO wheeze   Cough not keeping him up.  Sick contacts:  Had a large dinner at [employer] testing:   none     He has tried to treat with  Clarene Reamer, vit C.     No history of chronic lung disease such as asthma or COPD. Non-smoker.       Relevant past medical, surgical, family and social history reviewed and updated as indicated. Interim medical history since our last visit reviewed. Allergies and medications reviewed and updated. Outpatient Medications Prior to Visit  Medication Sig Dispense Refill   Ascorbic Acid (VITAMIN C) 1000 MG tablet Take 1,000 mg by mouth daily.     cholecalciferol (VITAMIN D3) 25 MCG (1000 UNIT) tablet Take 1,000 Units by mouth daily.     cyanocobalamin (VITAMIN B12) 1000 MCG tablet Take 1,000 mcg by mouth daily.     Magnesium 250 MG TABS Take by mouth daily.     Multiple Vitamins-Minerals (CENTRUM MEN) TABS Take by mouth daily.     Probiotic Product (PROBIOTIC DAILY PO) Take by mouth daily.     pyridOXINE (VITAMIN B6) 100 MG tablet Take 100 mg by mouth daily.     selenium ([facility] SELENIUM) 200 MCG TABS tablet Take 200 mcg by mouth daily.     zinc gluconate 50 MG tablet Take 50 mg by mouth daily.     tamsulosin (FLOMAX) 0.4 MG CAPS capsule TAKE 1  CAPSULE BY MOUTH EVERY DAY 90 capsule 2   No facility-administered medications prior to visit.     Per HPI unless specifically indicated in ROS section below Review of Systems  Constitutional:  Positive for chills. Negative for fatigue and fever.  HENT:  Positive for congestion. Negative for ear pain.   Eyes:  Negative for pain.  Respiratory:  Positive for cough. Negative for shortness of breath.   Cardiovascular:  Negative for chest pain, palpitations and leg swelling.  Gastrointestinal:  Negative for abdominal pain.  Genitourinary:  Negative for dysuria.  Musculoskeletal:  Negative for arthralgias.  Neurological:  Negative for syncope, light-headedness and headaches.  Psychiatric/Behavioral:  Negative for dysphoric mood.    Objective:  BP 122/70 (BP Location: Left Arm, Patient Position: Sitting, Cuff Size: Large)   Pulse 78   Temp (!) 97.4 F (36.3 C) (Temporal)   Ht 5' 11.75" (1.822 m)   Wt 233 lb 8 oz (105.9 kg)   SpO2 98%   BMI 31.89 kg/m   Wt Readings from Last 3 Encounters:  06/12/23 233 lb 8 oz (105.9 kg)  02/14/23 243 lb (110.2 kg)  01/23/23 242 lb 2 oz (109.8 kg)  Physical Exam Constitutional:      General: He is not in acute distress.    Appearance: Normal appearance. He is well-developed. He is not ill-appearing or toxic-appearing.  HENT:     Head: Normocephalic and atraumatic.     Right Ear: Hearing, tympanic membrane, ear canal and external ear normal. No tenderness. No foreign body. Tympanic membrane is not retracted or bulging.     Left Ear: Hearing, tympanic membrane, ear canal and external ear normal. No tenderness. No foreign body. Tympanic membrane is not retracted or bulging.     Nose: Congestion present. No mucosal edema or rhinorrhea.     Right Sinus: No maxillary sinus tenderness or frontal sinus tenderness.     Left Sinus: No maxillary sinus tenderness or frontal sinus tenderness.     Mouth/Throat:     Mouth: Oropharynx is clear and moist and  mucous membranes are normal.     Dentition: Normal dentition. No dental caries.     Pharynx: Uvula midline. No oropharyngeal exudate.     Tonsils: No tonsillar abscesses.  Eyes:     General: Lids are normal. Lids are everted, no foreign bodies appreciated.     Extraocular Movements: EOM normal.     Conjunctiva/sclera: Conjunctivae normal.     Pupils: Pupils are equal, round, and reactive to light.  Neck:     Thyroid: No thyroid mass or thyromegaly.     Vascular: No carotid bruit.     Trachea: Trachea and phonation normal.  Cardiovascular:     Rate and Rhythm: Normal rate and regular rhythm.     Pulses: Normal pulses.     Heart sounds: Normal heart sounds, S1 normal and S2 normal. No murmur heard.    No gallop.  Pulmonary:     Effort: Pulmonary effort is normal. No respiratory distress.     Breath sounds: Normal breath sounds. No wheezing, rhonchi or rales.  Abdominal:     General: Bowel sounds are normal.     Palpations: Abdomen is soft.     Tenderness: There is no abdominal tenderness. There is no CVA tenderness, guarding or rebound.     Hernia: No hernia is present.  Musculoskeletal:     Cervical back: Normal range of motion and neck supple.  Skin:    General: Skin is warm, dry and intact.     Findings: No rash.  Neurological:     Mental Status: He is alert.     Deep Tendon Reflexes: Reflexes are normal and symmetric.  Psychiatric:        Mood and Affect: Mood and affect normal.        Speech: Speech normal.        Behavior: Behavior normal.        Judgment: Judgment normal.       Results for orders placed or performed in visit on 06/12/23  POC COVID-19  Result Value Ref Range   SARS Coronavirus 2 Ag Positive (A) Negative    Assessment and Plan  COVID-19 Assessment & Plan: COVID19  Infection < 5 days from onset of symptoms in  vaccinated overweight individual with history of BMI 31  No clear sign of bacterial infection at this time.   No SOB.  No red  flags/need for ER visit or in-person exam at respiratory clinic at this time..    Pt higher risk for COVID complications given   BMI. GFR  > 60 and no medication contraindications.  Start paxlovid 5 day course. Reviewed  course of medication and side effect profile with patient in detail.   Symptomatic care with mucinex and cough suppressant at night. If SOB begins symptoms worsening.. have low threshold for in-person exam, if severe shortness of breath ER visit recommended.  Can monitor Oxygen saturation at home with home monitor if able to obtain.  Go to ER if O2 sat < 90% on room air.   Reviewed home care and provided information through MyChart.  Recommended quarantine 5 days isolation recommended. Return to work day 6 and wear mask for 4 more days to complete 10 days. Provided info about prevention of spread of COVID 19.    Runny nose -     POC COVID-19 BinaxNow  Urinary frequency -     Tamsulosin HCl; Take 1 capsule (0.4 mg total) by mouth daily.  Dispense: 90 capsule; Refill: 2  Other orders -     nirmatrelvir/ritonavir; Take 3 tablets by mouth 2 (two) times daily for 5 days. Patient GFR is > 60. Take nirmatrelvir (150 mg) 3 tablet(s) twice daily for 5 days and ritonavir (100 mg) one tablet twice daily for 5 days. DO NOT TAKE WITH TAMSULOSIN  Dispense: 30 tablet; Refill: 0    No follow-ups on file.   Kerby Nora, MD

## 2023-06-19 DIAGNOSIS — U071 COVID-19: Secondary | ICD-10-CM | POA: Insufficient documentation

## 2023-06-19 NOTE — Assessment & Plan Note (Signed)
COVID19  Infection < 5 days from onset of symptoms in  vaccinated overweight individual with history of BMI 31  No clear sign of bacterial infection at this time.   No SOB.  No red flags/need for ER visit or in-person exam at respiratory clinic at this time..    Pt higher risk for COVID complications given   BMI. GFR  > 60 and no medication contraindications.  Start paxlovid 5 day course. Reviewed course of medication and side effect profile with patient in detail.   Symptomatic care with mucinex and cough suppressant at night. If SOB begins symptoms worsening.. have low threshold for in-person exam, if severe shortness of breath ER visit recommended.  Can monitor Oxygen saturation at home with home monitor if able to obtain.  Go to ER if O2 sat < 90% on room air.   Reviewed home care and provided information through MyChart.  Recommended quarantine 5 days isolation recommended. Return to work day 6 and wear mask for 4 more days to complete 10 days. Provided info about prevention of spread of COVID 19.

## 2023-06-24 DIAGNOSIS — L910 Hypertrophic scar: Secondary | ICD-10-CM | POA: Diagnosis not present

## 2023-06-24 DIAGNOSIS — L708 Other acne: Secondary | ICD-10-CM | POA: Diagnosis not present

## 2024-02-01 ENCOUNTER — Emergency Department: Payer: MEDICAID

## 2024-02-01 ENCOUNTER — Other Ambulatory Visit: Payer: Self-pay

## 2024-02-01 ENCOUNTER — Emergency Department
Admission: EM | Admit: 2024-02-01 | Discharge: 2024-02-01 | Disposition: A | Discharge status: Home / Self Care | Payer: Self-pay | Attending: Emergency Medicine | Admitting: Emergency Medicine

## 2024-02-01 DIAGNOSIS — R531 Weakness: Secondary | ICD-10-CM | POA: Diagnosis present

## 2024-02-01 DIAGNOSIS — R2681 Unsteadiness on feet: Secondary | ICD-10-CM | POA: Insufficient documentation

## 2024-02-01 DIAGNOSIS — R42 Dizziness and giddiness: Secondary | ICD-10-CM | POA: Diagnosis not present

## 2024-02-01 DIAGNOSIS — R202 Paresthesia of skin: Secondary | ICD-10-CM | POA: Insufficient documentation

## 2024-02-01 LAB — CBC WITH DIFFERENTIAL/PLATELET
Abs Immature Granulocytes: 0.04 10*3/uL (ref 0.00–0.07)
Basophils Absolute: 0 10*3/uL (ref 0.0–0.1)
Basophils Relative: 0 %
Eosinophils Absolute: 0 10*3/uL (ref 0.0–0.5)
Eosinophils Relative: 0 %
HCT: 45.9 % (ref 39.0–52.0)
Hemoglobin: 14.5 g/dL (ref 13.0–17.0)
Immature Granulocytes: 0 %
Lymphocytes Relative: 5 %
Lymphs Abs: 0.5 10*3/uL — ABNORMAL LOW (ref 0.7–4.0)
MCH: 26.5 pg (ref 26.0–34.0)
MCHC: 31.6 g/dL (ref 30.0–36.0)
MCV: 83.8 fL (ref 80.0–100.0)
Monocytes Absolute: 0.4 10*3/uL (ref 0.1–1.0)
Monocytes Relative: 4 %
Neutro Abs: 9.9 10*3/uL — ABNORMAL HIGH (ref 1.7–7.7)
Neutrophils Relative %: 91 %
Platelets: 266 10*3/uL (ref 150–400)
RBC: 5.48 MIL/uL (ref 4.22–5.81)
RDW: 14.8 % (ref 11.5–15.5)
WBC: 10.8 10*3/uL — ABNORMAL HIGH (ref 4.0–10.5)
nRBC: 0 % (ref 0.0–0.2)

## 2024-02-01 LAB — COMPREHENSIVE METABOLIC PANEL WITH GFR
ALT: 75 U/L — ABNORMAL HIGH (ref 0–44)
AST: 54 U/L — ABNORMAL HIGH (ref 15–41)
Albumin: 4.2 g/dL (ref 3.5–5.0)
Alkaline Phosphatase: 49 U/L (ref 38–126)
Anion gap: 11 (ref 5–15)
BUN: 25 mg/dL — ABNORMAL HIGH (ref 6–20)
CO2: 25 mmol/L (ref 22–32)
Calcium: 9.3 mg/dL (ref 8.9–10.3)
Chloride: 107 mmol/L (ref 98–111)
Creatinine, Ser: 1.49 mg/dL — ABNORMAL HIGH (ref 0.61–1.24)
GFR, Estimated: 53 mL/min — ABNORMAL LOW (ref >60)
Glucose, Bld: 119 mg/dL — ABNORMAL HIGH (ref 70–99)
Potassium: 4 mmol/L (ref 3.5–5.1)
Sodium: 143 mmol/L (ref 135–145)
Total Bilirubin: 1.2 mg/dL (ref 0.0–1.2)
Total Protein: 8 g/dL (ref 6.5–8.1)

## 2024-02-01 LAB — URINALYSIS, ROUTINE W REFLEX MICROSCOPIC
Bacteria, UA: NONE SEEN
Bilirubin Urine: NEGATIVE
Glucose, UA: NEGATIVE mg/dL
Hgb urine dipstick: NEGATIVE
Ketones, ur: NEGATIVE mg/dL
Leukocytes,Ua: NEGATIVE
Nitrite: NEGATIVE
Protein, ur: 30 mg/dL — AB
Specific Gravity, Urine: 1.025 (ref 1.005–1.030)
pH: 5 (ref 5.0–8.0)

## 2024-02-01 LAB — TROPONIN I (HIGH SENSITIVITY)
Troponin I (High Sensitivity): 3 ng/L (ref <18)
Troponin I (High Sensitivity): 3 ng/L (ref <18)

## 2024-02-01 LAB — PROTIME-INR
INR: 1 (ref 0.8–1.2)
Prothrombin Time: 13.5 s (ref 11.4–15.2)

## 2024-02-01 LAB — APTT: aPTT: 26 s (ref 24–36)

## 2024-02-01 LAB — ETHANOL: Alcohol, Ethyl (B): <10 mg/dL (ref <10)

## 2024-02-01 MED ORDER — MECLIZINE HCL 25 MG PO TABS
25.0000 mg | ORAL_TABLET | Freq: Once | ORAL | Status: AC
  Administered 2024-02-01: 25 mg via ORAL
  Filled 2024-02-01: qty 1

## 2024-02-01 MED ORDER — SODIUM CHLORIDE 0.9% FLUSH
3.0000 mL | Freq: Once | INTRAVENOUS | Status: AC
  Administered 2024-02-01: 3 mL via INTRAVENOUS

## 2024-02-01 NOTE — ED Notes (Signed)
 Pt to MRI at this time.

## 2024-02-01 NOTE — ED Notes (Signed)
 Called lab to run first troponin. Provided urinal for UA.

## 2024-02-01 NOTE — ED Provider Notes (Signed)
 [facility] Provider Note    Event Date/Time   First MD Initiated Contact with Patient 02/01/24 1511     (approximate)   History   Weakness   HPI  Richard Haynes is a 63 y.o. male with a history of inguinal hernia who presents with dizziness and unsteady gait that started gradually yesterday and then got worse after he woke up this morning.  The patient states that he woke up today just not feeling right.  He states he feels lightheaded and weak, but denies a sensation of movement or of the room spinning around him.  He reports difficulty standing and unsteady on his feet.  He also states he has had slight difficulty speaking.  He reports tingling in his left arm which is new.  He has chronic tingling and numbness in the left leg.  He denies any numbness or weakness to the arm.  I reviewed the past medical records per the patient's most recent outpatient encounter was on 3/6 with urgent care for evaluation of viral URI symptoms.   Physical Exam   Triage Vital Signs: ED Triage Vitals  Encounter Vitals Group     BP 02/01/24 1416 (!) 197/165     Systolic BP Percentile --      Diastolic BP Percentile --      Pulse Rate 02/01/24 1416 (!) 104     Resp 02/01/24 1416 18     Temp 02/01/24 1416 98 F (36.7 C)     Temp src --      SpO2 02/01/24 1416 93 %     Weight 02/01/24 1417 240 lb (108.9 kg)     Height 02/01/24 1417 6' (1.829 m)     Head Circumference --      Peak Flow --      Pain Score 02/01/24 1417 0     Pain Loc --      Pain Education --      Exclude from Growth Chart --     Most recent vital signs: Vitals:   02/01/24 1857 02/01/24 2011  BP:  134/84  Pulse:  68  Resp:  16  Temp: 98 F (36.7 C)   SpO2:  100%     General: Alert, no distress.  CV:  Good peripheral perfusion.  Resp:  Normal effort.  Abd:  No distention.  Other:  EOMI.  PERRLA.  No photophobia.  No facial droop.  No dysarthria or aphasia.  No ataxia on finger-to-nose.   No pronator drift.  5/5 motor strength in all extremities.  Subjective decrease sensation to the LUE.   ED Results / Procedures / Treatments   Labs (all labs ordered are listed, but only abnormal results are displayed) Labs Reviewed  COMPREHENSIVE METABOLIC PANEL WITH GFR - Abnormal; Notable for the following components:      Result Value   Glucose, Bld 119 (*)    BUN 25 (*)    Creatinine, Ser 1.49 (*)    AST 54 (*)    ALT 75 (*)    GFR, Estimated 53 (*)    All other components within normal limits  CBC WITH DIFFERENTIAL/PLATELET - Abnormal; Notable for the following components:   WBC 10.8 (*)    Neutro Abs 9.9 (*)    Lymphs Abs 0.5 (*)    All other components within normal limits  URINALYSIS, ROUTINE W REFLEX MICROSCOPIC - Abnormal; Notable for the following components:   Color, Urine YELLOW (*)  APPearance HAZY (*)    Protein, ur 30 (*)    All other components within normal limits  PROTIME-INR  APTT  ETHANOL  TROPONIN I (HIGH SENSITIVITY)  TROPONIN I (HIGH SENSITIVITY)     EKG  ED ECG REPORT I, Dionne Bucy, the attending physician, personally viewed and interpreted this ECG.  Date: 02/01/2024 EKG Time: 1421 Rate: 93 Rhythm: normal sinus rhythm QRS Axis: normal Intervals: Incomplete RBBB ST/T Wave abnormalities: normal Narrative Interpretation: no evidence of acute ischemia    RADIOLOGY  CT head: I independently viewed and interpreted the images; there is no ICH.  Radiology report indicates no acute abnormality.  MR brain:   IMPRESSION:  Negative MRI of the brain. No acute or focal lesion to explain the  patient's symptoms.    PROCEDURES:  Critical Care performed: No  Procedures   MEDICATIONS ORDERED IN ED: Medications  sodium chloride flush (NS) 0.9 % injection 3 mL (3 mLs Intravenous Given 02/01/24 1531)  meclizine (ANTIVERT) tablet 25 mg (25 mg Oral Given 02/01/24 1536)     IMPRESSION / MDM / ASSESSMENT AND PLAN / ED COURSE  I  reviewed the triage vital signs and the nursing notes.  62 year old male with PMH as noted above presents with lightheadedness and unsteady gait as well as a slight disturbance of his speech and left arm tingling, all of which started gradually over 24 hours ago and then were worse when he awoke this morning.  On exam he was hypertensive in triage.  BP and other vital signs are normal.  Neurologic exam is nonfocal.  Differential diagnosis includes, but is not limited to, TIA, CVA, ICH, peripheral vertigo, dehydration, electrolyte abnormality, other metabolic cause, UTI or other infection.  We will obtain CT head, lab workup, and reassess.  The patient has no LVO symptoms and is out of the window for code stroke activation.  Patient's presentation is most consistent with acute presentation with potential threat to life or bodily function.  The patient is on the cardiac monitor to evaluate for evidence of arrhythmia and/or significant heart rate changes.   ----------------------------------------- 8:01 PM on 02/01/2024 -----------------------------------------  CT head is negative.  I proceeded with an MRI which is also negative for acute findings.  Lab workup is also reassuring.  Troponin is negative.  CMP shows mild AKI.  CBC shows minimal leukocytosis.  Urinalysis is negative.  On reassessment, the patient states that his symptoms have resolved and he is feeling much better.  Overall I suspect most likely dehydration, viral syndrome, or peripheral vertigo.  I have a low suspicion for TIA.  I did consider whether the patient may benefit from inpatient admission, however given his resolved symptoms and negative workup, I feel that discharge is reasonable.  The patient states he would like to go home.  I counseled him on the results of the workup and plan of care.  I recommended that he follow-up with his PMD.  I gave strict return precautions and he expressed understanding.   FINAL CLINICAL  IMPRESSION(S) / ED DIAGNOSES   Final diagnoses:  Unsteady gait  Weakness     Rx / DC Orders   ED Discharge Orders     None        Note:  This document was prepared using Dragon voice recognition software and may include unintentional dictation errors.    Dionne Bucy, MD 02/01/24 2245

## 2024-02-01 NOTE — ED Triage Notes (Signed)
 Pt comes with c/o right sided weakness. Pt states he woke up today and just didn't feel right. Pt states he felt like he couldn't stand or get his bearings. Pt states some numbness to right sided. Pt was unsteady with this Rn out in lobby and appeared off balance. Pt states he has had some slurred speech.

## 2024-02-01 NOTE — Discharge Instructions (Signed)
 Follow-up with your primary care provider soon as possible.  Your workup is negative.  There are no signs of a stroke on your CT or MRI.  However, as we discussed, it is possible that you could have had what is called a TIA (transient ischemic attack) which is essentially a temporary stroke.  It is also possible you could just be having vertigo.  Return to the ER for new, worsening, or persistent severe weakness, difficulty walking, dizziness, difficulty walking, any changes in your speech or vision, or any other new or worsening symptoms that concern you.

## 2024-02-01 NOTE — ED Notes (Signed)
 Pt back from MRI, watching TV, resting comfortably, no needs expressed at this time.

## 2024-02-05 ENCOUNTER — Telehealth: Payer: Self-pay

## 2024-02-05 NOTE — Transitions of Care (Post Inpatient/ED Visit) (Signed)
 Unable to reach patient by phone at home or cell phone and pt no longer employed at [employer]; also cannot contact emergency contact by phone. I spoke with [facility] and they have same contact #.      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  Follow Up Plan: No further outreach attempts will be made at this time. We have been unable to contact the patient.  Signature  Lewanda Rife, LPN

## 2024-06-17 ENCOUNTER — Ambulatory Visit: Payer: Self-pay

## 2024-06-17 NOTE — Telephone Encounter (Signed)
 FYI Only or Action Required?: FYI only for provider.  Patient was last seen in primary care on 06/12/2023 by Avelina Greig BRAVO, MD.  Called Nurse Triage reporting Leg Pain.  Symptoms began yesterday.  Interventions attempted: OTC medications: restless leg relief cream.  Symptoms are: gradually worsening.  Triage Disposition: See PCP When Office is Open (Within 3 Days)  Patient/caregiver understands and will follow disposition?: Yes  Copied from CRM #[id number]. Topic: Clinical - Red Word Triage >> Jun 17, 2024  5:13 PM Drema MATSU wrote: Red Word that prompted transfer to Nurse Triage: Patient has restless legs. He states that the back of his legs burns when he lay down and sit and a burning sensation from hip to leg. Reason for Disposition  [1] MODERATE pain (e.g., interferes with normal activities, limping) AND [2] present > 3 days  Answer Assessment - Initial Assessment Questions 1. ONSET: When did the pain start?      Two days 2. LOCATION: Where is the pain located?      Bilateral legs hip to thigh 3. PAIN: How bad is the pain?    (Scale 1-10; or mild, moderate, severe)     Burning on back of legs, worse when sitting or laying down 4. WORK OR EXERCISE: Has there been any recent work or exercise that involved this part of the body?      Doing a lot of painting and work 5. CAUSE: What do you think is causing the leg pain?     Restless leg symptoms 6. OTHER SYMPTOMS: Do you have any other symptoms? (e.g., chest pain, back pain, breathing difficulty, swelling, rash, fever, numbness, weakness)     Difficulty sleeping due to constant leg movement  Protocols used: Leg Pain-A-AH

## 2024-06-18 ENCOUNTER — Ambulatory Visit: Admitting: Internal Medicine

## 2024-06-18 ENCOUNTER — Encounter: Payer: Self-pay | Admitting: Internal Medicine

## 2024-06-18 VITALS — BP 122/84 | HR 66 | Temp 98.1°F | Ht 72.0 in | Wt 235.0 lb

## 2024-06-18 DIAGNOSIS — M79604 Pain in right leg: Secondary | ICD-10-CM | POA: Insufficient documentation

## 2024-06-18 DIAGNOSIS — M79605 Pain in left leg: Secondary | ICD-10-CM

## 2024-06-18 NOTE — Progress Notes (Signed)
 Subjective:    Patient ID: Richard Haynes, male    DOB: 06/29/1961, 63 y.o.   MRN: [id number]  HPI Here due to pain in his legs  For 3 days, lying on right side on couch Now getting shock pains behind knees. Now up to hamstrings and towards hips Wonders if it is due to couch being sunk in No sig back pain Did stop when he moved position and was leaning on left (he sleeps on the bed due to estrangement from girlfriend)  No leg weakness--once he gets moving around Has been pushing mowing lawns and doing a lot of painting  Current Outpatient Medications on File Prior to Visit  Medication Sig Dispense Refill   Ascorbic Acid (VITAMIN C) 1000 MG tablet Take 1,000 mg by mouth daily.     cholecalciferol (VITAMIN D3) 25 MCG (1000 UNIT) tablet Take 1,000 Units by mouth daily.     cyanocobalamin (VITAMIN B12) 1000 MCG tablet Take 1,000 mcg by mouth daily.     Magnesium 250 MG TABS Take by mouth daily.     Multiple Vitamins-Minerals (CENTRUM MEN) TABS Take by mouth daily.     pyridOXINE (VITAMIN B6) 100 MG tablet Take 100 mg by mouth daily.     selenium ([facility] SELENIUM) 200 MCG TABS tablet Take 200 mcg by mouth daily.     tamsulosin  (FLOMAX ) 0.4 MG CAPS capsule Take 1 capsule (0.4 mg total) by mouth daily. 90 capsule 2   zinc  gluconate 50 MG tablet Take 50 mg by mouth daily.     Probiotic Product (PROBIOTIC DAILY PO) Take by mouth daily. (Patient not taking: Reported on 06/18/2024)     No current facility-administered medications on file prior to visit.    Allergies  Allergen Reactions   Iodine Hives   Iodinated Contrast Media Rash   Shellfish Allergy Rash    Past Medical History:  Diagnosis Date   Inguinal hernia    Rectal bleeding     Past Surgical History:  Procedure Laterality Date   COLONOSCOPY WITH PROPOFOL  N/A 02/14/2023   Procedure: COLONOSCOPY WITH PROPOFOL ;  Surgeon: Jinny Carmine, MD;  Location[facility];  Service: Endoscopy;  Laterality: N/A;   HERNIA  REPAIR Right    2023   VASECTOMY  03/28/2001    Family History  Problem Relation Age of Onset   Diabetes Mother    Parkinson's disease Mother    Dementia Mother     Social History   Socioeconomic History   Marital status: Legally Separated    Spouse name: Not on file   Number of children: 3   Years of education: Not on file   Highest education level: Not on file  Occupational History   Not on file  Tobacco Use   Smoking status: Never   Smokeless tobacco: Never  Vaping Use   Vaping status: Former   Substances: Flavoring  Substance and Sexual Activity   Alcohol use: Yes    Comment: social   Drug use: Yes    Types: Marijuana   Sexual activity: Yes    Birth control/protection: Surgical  Other Topics Concern   Not on file  Social History Narrative   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  Musculoskeletal:     Comments: No spine tenderness Normal back flexion SLR negative Normal ROM in hips  Neurological:     Mental Status: He is alert.     Comments: Normal gait and leg strength            Assessment & Plan:

## 2024-06-18 NOTE — Telephone Encounter (Signed)
 Noted---I will check him at today's visit

## 2024-06-18 NOTE — Assessment & Plan Note (Signed)
 Sounds like positional nerve compression (?both sciatics) Could be related to increased work lately---but clearly is prompted by lying for sleep on couch Discussed better and more supportive sleep spot

## 2024-09-28 ENCOUNTER — Ambulatory Visit: Admitting: Family Medicine

## 2024-09-30 ENCOUNTER — Ambulatory Visit
Admission: RE | Admit: 2024-09-30 | Discharge: 2024-09-30 | Disposition: A | Source: Ambulatory Visit | Attending: Nurse Practitioner

## 2024-09-30 ENCOUNTER — Ambulatory Visit: Admitting: Nurse Practitioner

## 2024-09-30 ENCOUNTER — Ambulatory Visit: Admitting: Primary Care

## 2024-09-30 VITALS — BP 140/90 | HR 80 | Temp 98.2°F | Ht 72.0 in | Wt 250.2 lb

## 2024-09-30 DIAGNOSIS — M12812 Other specific arthropathies, not elsewhere classified, left shoulder: Secondary | ICD-10-CM | POA: Diagnosis not present

## 2024-09-30 DIAGNOSIS — M75102 Unspecified rotator cuff tear or rupture of left shoulder, not specified as traumatic: Secondary | ICD-10-CM | POA: Diagnosis not present

## 2024-09-30 NOTE — Progress Notes (Signed)
 Established Patient Office Visit  Subjective   Patient ID: Richard Haynes, male    DOB: 28-Jun-1961  Age: 63 y.o. MRN: [id number]  Chief Complaint  Patient presents with   Shoulder Injury    Pt complains of L shoulder strain. States of referral to chiropractor. Complains that he cannot lift his left shoulder up all the way. States of no pain.       Discussed the use of AI scribe software for clinical note transcription with the patient, who gave verbal consent to proceed.  History of Present Illness Richard Haynes is a 63 year old male who presents with left shoulder mobility issues during weightlifting activities.  He has been experiencing difficulty with the mobility of his left shoulder, particularly during weightlifting exercises like dumbbell flys. The shoulder does not achieve the same range of motion as before, and he requires assistance from his girlfriend to move it. These issues have persisted for the past few years.  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 Currently, he uses lighter weights, such as three and five-pound dumbbells, and aims to regain his previous strength and range of motion.  No pain or numbness in the shoulder during regular activities. However, he experiences tingling in the left side when lying in bed and using his phone, which resolves upon movement. No weakness in the arm is reported.  He recalls that laying on his shoulder frequently in the past caused discomfort, but this has since resolved. Certain sleeping positions, such as laying between his legs, previously exacerbated the shoulder discomfort.  He describes himself as a 'health freak' and does take potassium supplements or eat bananas regularly. He maintains a healthy lifestyle with vitamins and other health practices.    Review of Systems  Constitutional:  Negative for chills and fever.  Respiratory:   Negative for shortness of breath.   Cardiovascular:  Negative for chest pain.  Musculoskeletal:  Negative for joint pain.  Neurological:  Positive for tingling. Negative for weakness.      Objective:     BP (!) 140/90   Pulse 80   Temp 98.2 F (36.8 C) (Oral)   Ht 6' (1.829 m)   Wt 250 lb 3.2 oz (113.5 kg)   SpO2 98%   BMI 33.93 kg/m  BP Readings from Last 3 Encounters:  09/30/24 (!) 140/90  06/18/24 122/84  02/01/24 134/84   Wt Readings from Last 3 Encounters:  09/30/24 250 lb 3.2 oz (113.5 kg)  06/18/24 235 lb (106.6 kg)  02/01/24 240 lb (108.9 kg)   SpO2 Readings from Last 3 Encounters:  09/30/24 98%  06/18/24 98%  02/01/24 100%      Physical Exam Vitals and nursing note reviewed.  Constitutional:      Appearance: Normal appearance.  Cardiovascular:     Rate and Rhythm: Normal rate and regular rhythm.     Heart sounds: Normal heart sounds.  Pulmonary:     Effort: Pulmonary effort is normal.     Breath sounds: Normal breath sounds.  Musculoskeletal:        General: No tenderness.     Right shoulder: Normal strength. Normal pulse.     Left shoulder: No deformity, tenderness or bony tenderness. Decreased range of motion. Normal strength. Normal pulse.     Comments: Bilateral upper extremity strength 5/5 Bicep reflex 2+ bilaterally  Raid pulse 2+ bilaterally   Difficulty  in raising left arm above heard with out anterior rotation   Neurological:     Mental Status: He is alert.      No results found for any visits on 09/30/24.    The 10-year ASCVD risk score (Arnett DK, et al., 2019) is: 10.2%    Assessment & Plan:   Problem List Items Addressed This Visit   None Visit Diagnoses       Left rotator cuff tear arthropathy    -  Primary   Relevant Orders   DG Shoulder Left      Assessment and Plan Assessment & Plan Left rotator cuff tear Suspected tear with limited range of motion and difficulty with rotation. No pain or numbness, occasional  tingling when lying down. Chiropractic care unlikely beneficial. Physical therapy recommended. MRI and orthopedic consultation if no improvement. Avoid heavy lifting to prevent worsening. - Ordered x-ray of left shoulder to assess bone and joint integrity. - Advised against heavy lifting or benching. - Recommended physical therapy to improve shoulder function , pending xray  - Consider referral to sports medicine specialist if x-ray is normal and physical therapy is ineffective.  Return in about 3 months (around 12/29/2024) for CPE and Labs.    Adina Crandall, NP

## 2024-09-30 NOTE — Patient Instructions (Signed)
Nice to see you today I will be in touch with the xray once I have the results Follow up if you do not improve

## 2024-10-06 ENCOUNTER — Ambulatory Visit: Payer: Self-pay | Admitting: Nurse Practitioner

## 2024-10-06 DIAGNOSIS — M12812 Other specific arthropathies, not elsewhere classified, left shoulder: Secondary | ICD-10-CM

## 2024-10-08 NOTE — Telephone Encounter (Signed)
 Copied from CRM #[id number]. Topic: General - Other >> Oct 07, 2024  4:53 PM Jasmin G wrote: Reason for CRM: Pt called regarding recent missed call from Ms. Sebastian Shu, CMA, called [facility] and she was not available. Call pt back at [phone].

## 2024-10-11 NOTE — Telephone Encounter (Signed)
-----   Message from [facility] T sent at 10/08/2024 12:46 PM EST ----- Called patient reviewed all information and repeated back to me. Will call if any questions.   Pt would like the [place] location for physical therapy. ----- Message ----- From: Wendee Lynwood HERO, NP Sent: 10/06/2024   7:09 AM EST To: Wendee Gunnels  Notified via My Chart   See below  ----- Message ----- From: Interface, Rad Results In Sent: 10/06/2024   1:35 AM EST To: Lynwood HERO Wendee, NP

## 2024-10-11 NOTE — Telephone Encounter (Signed)
 Referral placed.

## 2024-10-11 NOTE — Addendum Note (Signed)
 Addended by: WENDEE LYNWOOD HERO on: 10/11/2024 09:11 AM   Modules accepted: Orders

## 2024-12-01 ENCOUNTER — Ambulatory Visit: Admitting: Family Medicine

## 2024-12-01 VITALS — BP 126/82 | HR 72 | Temp 97.8°F | Ht 72.0 in | Wt 241.5 lb

## 2024-12-01 DIAGNOSIS — R040 Epistaxis: Secondary | ICD-10-CM

## 2024-12-01 NOTE — Patient Instructions (Addendum)
 Direct pressure at the bridge of the nose for 10-15 minutes  You can also use pressure with a wet tea bag  Oxymetazoline (commonly branded as Afrin  Afrin belongs to a class of drugs called vasoconstrictors. Its primary job is to shrink the blood vessels in the lining of your nose. When you have a nosebleed, shrinking those vessels helps reduce blood flow and encourages clotting.  How to Use It for a Nosebleed If youre dealing with a standard front of the nose bleed, here is the typical protocol:  Blow your nose: This sounds counterintuitive, but you need to clear out any large clots that are preventing the medication from reaching the skin.  Apply the spray: Give the affected nostril 2-3 sprays of Afrin. Alternatively, soak a small cotton ball in the spray and tuck it into the nostril.  Pinch and Lean: Pinch the soft part of your nose (just below the bony bridge) and lean forward slightly. Hold this firmly for a full 10 minutes without letting go to check on it.

## 2024-12-01 NOTE — Progress Notes (Unsigned)
 "    Chosen Geske T. Alvar Malinoski, MD, CAQ Sports Medicine [facility] at [facility] [address] [place], [address]  Phone: [phone]  FAX: [phone]  Richard Haynes - 64 y.o. male  MRN [id number]  Date of Birth: Jan 19, 1961  Date: 12/01/2024  PCP: Wendee Lynwood HERO, NP  Referral: Wendee Lynwood HERO, NP  Chief Complaint  Patient presents with   Epistaxis    Last Monday Yesterday Today    Subjective:   Richard Haynes is a 64 y.o. very pleasant male patient with Body mass index is 32.75 kg/m. who presents with the following:  Discussed the use of AI scribe software for clinical note transcription with the patient, who gave verbal consent to proceed.   History of Present Illness Richard Haynes is a 64 year old male who presents with recurrent nosebleeds.  He has been experiencing recurrent epistaxis that began last week while shoveling snow for an extended period. The episodes have been frequent, with significant bleeding occurring yesterday and today at work. He describes the bleeding as producing 'a lot of blood clots' and mentions that he had to 'pull one out,' which led to continuous bleeding.  He initially used a nasal spray for allergies, which he believes may have contributed to the onset of the epistaxis. He is unsure of the specific nasal spray used but recalls it was intended for allergies. He has been using Vaseline to keep his nasal passages moist. The house he stays in is heated with portable heaters, leading to dry air conditions.  He recalls having the flu recently, which he associates with the onset of his symptoms. He experienced fever, body aches, and a runny nose during this period, which he believes may have contributed to the epistaxis.   He is concerned about the impact of the epistaxis on his daily activities, including a recent job interview, and is seeking ways to manage the condition effectively.    Review of Systems is noted in  the HPI, as appropriate  Objective:   BP 126/82   Pulse 72   Temp 97.8 F (36.6 C) (Temporal)   Ht 6' (1.829 m)   Wt 241 lb 8 oz (109.5 kg)   SpO2 96%   BMI 32.75 kg/m   GEN: No acute distress; alert,appropriate. PULM: Breathing comfortably in no respiratory distress PSYCH: Normally interactive.   Laboratory and Imaging Data:  Assessment and Plan:     ICD-10-CM   1. Bleeding from the nose  R04.0      Assessment & Plan Epistaxis Recurrent epistaxis likely due recent URI vs flu, nasal steroid could contribute. Recent significant episodes with clots. Possible contributing factors include environmental dryness. Nasal mucosa likely fragile. - Use Afrin nasal spray as needed for acute epistaxis. - Apply Vaseline inside nostrils to maintain moisture. - Educated on proper nasal pressure technique: pinch nose bridge and lean forward. - Consider ENT referral if epistaxis persists or becomes recurrent.   Disposition: f/u prn  [employer] speech-to-text software was used for transcription in this dictation.  Possible transcriptional errors can occur using Animal nutritionist.   Signed,  Jacques DASEN. Avah Bashor, MD   Outpatient Encounter Medications as of 12/01/2024  Medication Sig   Ascorbic Acid (VITAMIN C) 1000 MG tablet Take 1,000 mg by mouth daily.   cholecalciferol (VITAMIN D3) 25 MCG (1000 UNIT) tablet Take 1,000 Units by mouth daily.   cyanocobalamin (VITAMIN B12) 1000 MCG tablet Take 1,000 mcg by mouth daily.  Magnesium 250 MG TABS Take by mouth daily.   Multiple Vitamins-Minerals (CENTRUM MEN) TABS Take by mouth daily.   Probiotic Product (PROBIOTIC DAILY PO) Take by mouth daily.   pyridOXINE (VITAMIN B6) 100 MG tablet Take 100 mg by mouth daily.   selenium ([employer] SELENIUM) 200 MCG TABS tablet Take 200 mcg by mouth daily.   tamsulosin  (FLOMAX ) 0.4 MG CAPS capsule Take 1 capsule (0.4 mg total) by mouth daily.   zinc  gluconate 50 MG tablet Take 50 mg by mouth daily.    No facility-administered encounter medications on file as of 12/01/2024.   "

## 2024-12-02 ENCOUNTER — Encounter: Payer: Self-pay | Admitting: Family Medicine

## 2024-12-29 ENCOUNTER — Encounter: Admitting: Nurse Practitioner
